# Patient Record
Sex: Male | Born: 1955 | Race: White | Hispanic: No | Marital: Married | Smoking: Never smoker
Health system: Southern US, Community
[De-identification: ages and names within clinical notes are randomized; demographics above are authoritative.]

## PROBLEM LIST (undated history)

## (undated) DIAGNOSIS — G4730 Sleep apnea, unspecified: Secondary | ICD-10-CM

## (undated) DIAGNOSIS — E785 Hyperlipidemia, unspecified: Secondary | ICD-10-CM

## (undated) DIAGNOSIS — C44611 Basal cell carcinoma of skin of unspecified upper limb, including shoulder: Secondary | ICD-10-CM

## (undated) DIAGNOSIS — S42001A Fracture of unspecified part of right clavicle, initial encounter for closed fracture: Secondary | ICD-10-CM

## (undated) DIAGNOSIS — Z86010 Personal history of colonic polyps: Secondary | ICD-10-CM

## (undated) DIAGNOSIS — F909 Attention-deficit hyperactivity disorder, unspecified type: Secondary | ICD-10-CM

## (undated) HISTORY — DX: Personal history of colonic polyps: Z86.010

## (undated) HISTORY — DX: Attention-deficit hyperactivity disorder, unspecified type: F90.9

## (undated) HISTORY — DX: Hyperlipidemia, unspecified: E78.5

## (undated) HISTORY — PX: POLYPECTOMY: SHX149

## (undated) HISTORY — DX: Fracture of unspecified part of right clavicle, initial encounter for closed fracture: S42.001A

## (undated) HISTORY — DX: Basal cell carcinoma of skin of unspecified upper limb, including shoulder: C44.611

## (undated) HISTORY — DX: Sleep apnea, unspecified: G47.30

---

## 2003-02-10 ENCOUNTER — Ambulatory Visit (HOSPITAL_COMMUNITY): Admission: RE | Admit: 2003-02-10 | Discharge: 2003-02-10 | Payer: Self-pay | Admitting: Pulmonary Disease

## 2009-09-27 DIAGNOSIS — S42001A Fracture of unspecified part of right clavicle, initial encounter for closed fracture: Secondary | ICD-10-CM

## 2009-09-27 HISTORY — DX: Fracture of unspecified part of right clavicle, initial encounter for closed fracture: S42.001A

## 2009-10-20 ENCOUNTER — Emergency Department (HOSPITAL_COMMUNITY): Admission: EM | Admit: 2009-10-20 | Discharge: 2009-10-20 | Payer: Self-pay | Admitting: Emergency Medicine

## 2010-09-11 ENCOUNTER — Ambulatory Visit: Payer: Self-pay | Admitting: Internal Medicine

## 2010-09-11 ENCOUNTER — Encounter: Payer: Self-pay | Admitting: Internal Medicine

## 2010-09-11 LAB — CONVERTED CEMR LAB
AST: 21 units/L (ref 0–37)
BUN: 18 mg/dL (ref 6–23)
Basophils Relative: 0.9 % (ref 0.0–3.0)
Bilirubin, Direct: 0.1 mg/dL (ref 0.0–0.3)
CO2: 31 meq/L (ref 19–32)
Calcium: 9.4 mg/dL (ref 8.4–10.5)
Chloride: 103 meq/L (ref 96–112)
Creatinine, Ser: 0.9 mg/dL (ref 0.4–1.5)
Eosinophils Absolute: 0.2 10*3/uL (ref 0.0–0.7)
GFR calc non Af Amer: 90.35 mL/min (ref >60.00)
HCT: 45.1 % (ref 39.0–52.0)
Hemoglobin: 15.7 g/dL (ref 13.0–17.0)
Lymphocytes Relative: 47 % — ABNORMAL HIGH (ref 12.0–46.0)
Lymphs Abs: 2.8 10*3/uL (ref 0.7–4.0)
MCHC: 34.8 g/dL (ref 30.0–36.0)
MCV: 88.9 fL (ref 78.0–100.0)
Neutrophils Relative %: 40.5 % — ABNORMAL LOW (ref 43.0–77.0)
Potassium: 4.2 meq/L (ref 3.5–5.1)
TSH: 0.73 microintl units/mL (ref 0.35–5.50)
Total Bilirubin: 0.9 mg/dL (ref 0.3–1.2)
VLDL: 24.4 mg/dL (ref 0.0–40.0)
WBC: 5.9 10*3/uL (ref 4.5–10.5)

## 2010-09-14 ENCOUNTER — Encounter: Payer: Self-pay | Admitting: Internal Medicine

## 2010-09-15 ENCOUNTER — Encounter (INDEPENDENT_AMBULATORY_CARE_PROVIDER_SITE_OTHER): Payer: Self-pay | Admitting: *Deleted

## 2010-09-16 ENCOUNTER — Ambulatory Visit: Payer: Self-pay | Admitting: Internal Medicine

## 2010-09-22 ENCOUNTER — Ambulatory Visit: Payer: Self-pay | Admitting: Internal Medicine

## 2010-09-22 DIAGNOSIS — Z860100 Personal history of colon polyps, unspecified: Secondary | ICD-10-CM

## 2010-09-22 HISTORY — DX: Personal history of colon polyps, unspecified: Z86.0100

## 2010-09-22 HISTORY — PX: COLONOSCOPY: SHX174

## 2010-09-22 HISTORY — DX: Personal history of colonic polyps: Z86.010

## 2010-09-23 ENCOUNTER — Encounter: Payer: Self-pay | Admitting: Internal Medicine

## 2010-10-29 NOTE — Miscellaneous (Signed)
Summary: [facility] Previsit/prep  Clinical Lists Changes  Medications: Added new medication of MOVIPREP 100 GM  SOLR (PEG-KCL-NACL-NASULF-NA ASC-C) As per prep instructions. - Signed Rx of MOVIPREP 100 GM  SOLR (PEG-KCL-NACL-NASULF-NA ASC-C) As per prep instructions.;  #1 x 0;  Signed;  Entered by: Wyona Almas RN;  Authorized by: Iva Boop MD, FACG;  Method used: Electronically to [id number] [address], [place], [place]  [address], Ph: [phone] or [phone], Fax: [phone] Observations: Added new observation of NKA: T (09/16/2010 8:33)    Prescriptions: MOVIPREP 100 GM  SOLR (PEG-KCL-NACL-NASULF-NA ASC-C) As per prep instructions.  #1 x 0   Entered by:   Wyona Almas RN   Authorized by:   Iva Boop MD, [facility]   Signed by:   Wyona Almas RN on 09/16/2010   Method used:   Electronically to        [facility] (retail)       [address]       [place], [place]  [address]       Ph: [phone] or [phone]       Fax: [phone]   RxID:   [id number]

## 2010-10-29 NOTE — Assessment & Plan Note (Signed)
Summary: New / Bcbs / # / cd   Vital Signs:  Patient profile:   55 year old male Height:      71 inches Weight:      186 pounds BMI:     26.04 O2 Sat:      97 % on Room air Temp:     98.6 degrees F oral Pulse rate:   70 / minute Pulse rhythm:   regular Resp:     16 per minute BP sitting:   130 / 72  (left arm) Cuff size:   large  Vitals Entered By: Rock Nephew CMA (September 11, 2010 10:58 AM)  Nutrition Counseling: Patient's BMI is greater than 25 and therefore counseled on weight management options.  O2 Flow:  Room air  Primary Care Ziare Orrick:  Etta Grandchild MD   History of Present Illness: New to me for a complete physical with no complaints.  Preventive Screening-Counseling & Management  Alcohol-Tobacco     Alcohol drinks/day: <1     Alcohol type: beer     >5/day in last 3 mos: no     Alcohol Counseling: not indicated; use of alcohol is not excessive or problematic     Feels need to cut down: no     Feels annoyed by complaints: no     Feels guilty re: drinking: no     Needs 'eye opener' in am: no     Smoking Status: never     Tobacco Counseling: not indicated; no tobacco use  Caffeine-Diet-Exercise     Does Patient Exercise: yes     Type of exercise: biking     Exercise (avg: min/session): 30-60     Times/week: 4  Hep-HIV-STD-Contraception     Hepatitis Risk: no risk noted     HIV Risk: no risk noted     STD Risk: no risk noted     TSE monthly: yes     Testicular SE Education/Counseling to perform regular STE     Sun Exposure-Excessive: no     Sun Exposure Counseling: to decrease sun exposure  Safety-Violence-Falls     Seat Belt Use: yes     Helmet Use: n/a     Firearms in the Home: firearms in the home     Firearm Counseling: to practice firearm safety     Smoke Detectors: yes     Violence in the Home: no risk noted     Sexual Abuse: no      Sexual History:  currently monogamous.        Drug Use:  no.        Blood Transfusions:  no.     Medications Prior to Update: 1)  None  Current Medications (verified): 1)  Methylphenidate Hcl Cr 20 Mg Cr-Tabs (Methylphenidate Hcl) .... 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 Never Smoked Alcohol use-yes Drug use-no Regular exercise-yes  Risk Factors: Alcohol Use: <1 (09/11/2010) >5 drinks/d w/in last 3 months: no (09/11/2010) Exercise: yes (09/11/2010)  Risk Factors: Smoking Status: never (09/11/2010)  Past Medical History: ADHD right clavicle fracture Jan 2011 BCC left forearm 10 years ago  Past Surgical History: Denies surgical history  Family History: none  Social History: 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  The patient denies anorexia, fever, weight loss, chest pain, syncope, dyspnea on exertion, peripheral edema, prolonged cough, headaches, hemoptysis, abdominal pain, melena, hematochezia, severe indigestion/heartburn, hematuria, suspicious skin lesions, transient blindness, difficulty walking, depression, abnormal bleeding, enlarged lymph nodes, angioedema, and testicular masses.    Physical Exam  General:  alert, well-developed, well-nourished, well-hydrated, appropriate dress, normal appearance, healthy-appearing, cooperative to examination, and good hygiene.   Head:  normocephalic, atraumatic, no abnormalities observed, and no abnormalities palpated.    Eyes:  vision grossly intact, pupils equal, pupils round, and pupils reactive to light.   Ears:  External ear exam shows no significant lesions or deformities.  Otoscopic examination reveals clear canals, tympanic membranes are intact bilaterally without bulging, retraction, inflammation or discharge. Hearing is grossly normal bilaterally. Nose:  External nasal examination shows no deformity or inflammation. Nasal mucosa are pink and moist without lesions or exudates. Mouth:  Oral mucosa and oropharynx without lesions or exudates.  Teeth in good repair. Neck:  supple, full ROM, no masses, no thyromegaly, no thyroid nodules or tenderness, no JVD, normal carotid upstroke, no carotid bruits, no cervical lymphadenopathy, and no neck tenderness.   Breasts:  No masses or gynecomastia noted Lungs:  normal respiratory effort, no intercostal retractions, no accessory muscle use, normal breath sounds, no dullness, no fremitus, no crackles, and no wheezes.   Heart:  normal rate, regular rhythm, no murmur, no gallop, no rub, and no JVD.   Abdomen:  soft, non-tender, normal bowel sounds, no distention, no masses, no guarding, no rigidity, no rebound tenderness, no abdominal hernia, no inguinal hernia, no hepatomegaly, and no splenomegaly.   Rectal:  No external abnormalities noted. Normal sphincter tone. No rectal masses or tenderness. Genitalia:  uncircumcised, no hydrocele, no varicocele, no scrotal masses, no testicular masses or atrophy, no cutaneous lesions, and no urethral discharge.   Prostate:  no gland enlargement, no nodules, no asymmetry, and no induration.   Msk:  normal ROM, no joint tenderness, no joint swelling, no joint warmth, no redness over joints, no joint deformities, no joint instability, no crepitation, and no muscle atrophy.   Pulses:  R and L carotid,radial,femoral,dorsalis pedis and posterior tibial pulses are full and equal bilaterally Extremities:  No clubbing, cyanosis, edema, or  deformity noted with normal full range of motion of all joints.   Neurologic:  No cranial nerve deficits noted. Station and gait are normal. Plantar reflexes are down-going bilaterally. DTRs are symmetrical throughout. Sensory, motor and coordinative functions appear intact. Skin:  turgor normal, color normal, no rashes, no suspicious lesions, no ecchymoses, no petechiae, no purpura, no ulcerations, and no edema.   Cervical Nodes:  no anterior cervical adenopathy and no posterior cervical adenopathy.   Axillary Nodes:  no R axillary adenopathy and no L axillary adenopathy.   Inguinal Nodes:  no R inguinal adenopathy and no L inguinal adenopathy.   Psych:  Cognition and judgment appear intact. Alert and cooperative with normal attention span and concentration. No apparent delusions, illusions, hallucinations   Impression & Recommendations:  Problem # 1:  ROUTINE GENERAL MEDICAL EXAM@HEALTH  CARE FACL (ICD-V70.0)  Orders: Venipuncture ([address]) TLB-Lipid Panel (80061-LIPID) TLB-BMP (Basic  Metabolic Panel-BMET) (80048-METABOL) TLB-CBC Platelet - w/Differential (85025-CBCD) TLB-Hepatic/Liver Function Pnl (80076-HEPATIC) TLB-TSH (Thyroid Stimulating Hormone) (84443-TSH) TLB-PSA (Prostate Specific Antigen) (84153-PSA) EKG w/ Interpretation ([address]) Hemoccult Guaiac-1 spec.(in office) ([address]) Gastroenterology Referral (GI)  Td Booster: Historical (09/27/2004)   Flu Vax: Historical (07/28/2010)    Discussed using sunscreen, use of alcohol, drug use, self testicular exam, routine dental care, routine eye care, routine physical exam, seat belts, multiple vitamins,  and recommendations for immunizations.  Discussed exercise and checking cholesterol.  Discussed gun safety, safe sex, and contraception. Also recommend checking PSA.  Complete Medication List: 1)  Methylphenidate Hcl Cr 20 Mg Cr-tabs (Methylphenidate hcl) .... Take 1 tablet by mouth three times a day  Colorectal Screening:  Current  Recommendations:    Hemoccult: NEG X 1 today    Colonoscopy recommended: scheduled with G.I.  PSA Screening:    Reviewed PSA screening recommendations: PSA ordered  Immunization & Chemoprophylaxis:    Tetanus vaccine: Historical  (09/27/2004)    Influenza vaccine: Historical  (07/28/2010)  Patient Instructions: 1)  Please schedule a follow-up appointment as needed. 2)  It is important that you exercise regularly at least 20 minutes 5 times a week. If you develop chest pain, have severe difficulty breathing, or feel very tired , stop exercising immediately and seek medical attention. 3)  You need to lose weight. Consider a lower calorie diet and regular exercise.  4)  Schedule a colonoscopy/sigmoidoscopy to help detect colon cancer. 5)  Take an Aspirin every day.   Orders Added: 1)  Venipuncture [[address]] 2)  TLB-Lipid Panel [80061-LIPID] 3)  TLB-BMP (Basic Metabolic Panel-BMET) [80048-METABOL] 4)  TLB-CBC Platelet - w/Differential [85025-CBCD] 5)  TLB-Hepatic/Liver Function Pnl [80076-HEPATIC] 6)  TLB-TSH (Thyroid Stimulating Hormone) [84443-TSH] 7)  TLB-PSA (Prostate Specific Antigen) [60454-UJW] 8)  New Patient 40-64 years [99[address]] 9)  EKG w/ Interpretation [93000] 10)  Hemoccult Guaiac-1 spec.(in office) [82270] 11)  Gastroenterology Referral [GI]   Immunization History:  Influenza Immunization History:    Influenza:  historical (07/28/2010)   Immunization History:  Influenza Immunization History:    Influenza:  Historical (07/28/2010)  Preventive Care Screening  Last Tetanus Booster:    Date:  09/27/2004    Results:  Historical

## 2010-10-29 NOTE — Procedures (Signed)
Summary: Colonoscopy  Patient: Cory Joyce Note: All result statuses are Final unless otherwise noted.  Tests: (1) Colonoscopy (COL)   COL Colonoscopy           DONE     [facility]     [address]     [place], [place]  [address]           COLONOSCOPY PROCEDURE REPORT           PATIENT:  Cory Joyce, Cory Joyce  MR#:  [id number]     BIRTHDATE:  1956/06/15, 53 yrs. old  GENDER:  male     ENDOSCOPIST:  Iva Boop, MD, [facility]     REF. BY:  Etta Grandchild, M.D.     PROCEDURE DATE:  09/22/2010     PROCEDURE:  Colonoscopy with snare polypectomy     ASA CLASS:  Class I     INDICATIONS:  Routine Risk Screening     MEDICATIONS:   Fentanyl 50 mcg IV, Versed 6 mg IV           DESCRIPTION OF PROCEDURE:   After the risks benefits and     alternatives of the procedure were thoroughly explained, informed     consent was obtained.  Digital rectal exam was performed and     revealed no abnormalities and normal prostate.   The LB CF-H180AL     [id number] endoscope was introduced through the anus and advanced to     the cecum, which was identified by both the appendix and ileocecal     valve, without limitations.  The quality of the prep was     excellent, using MoviPrep.  The instrument was then slowly     withdrawn as the colon was fully examined. Insertion: 4:35 minutes     Withdrawal: 14:04 minutes     <<PROCEDUREIMAGES>>           FINDINGS:  Three polyps were found. All diminutive ascending (1)     and sigmoid (2). Polyps were snared without cautery. Retrieval was     successful.  Mild diverticulosis was found in the sigmoid colon.     This was otherwise a normal examination of the colon.   Retroflexed     views in the right colon and  rectum revealed no abnormalities.     The scope was then withdrawn from the patient and the procedure     completed.           COMPLICATIONS:  None     ENDOSCOPIC IMPRESSION:     1) Three diminutive polyps removed, largest 5 mm.     2)  Mild diverticulosis in the sigmoid colon     3) Otherwise normal examination, excellent prep           REPEAT EXAM:  In for Colonoscopy, pending biopsy results.           Iva Boop, MD, [facility]           CC:  Etta Grandchild, MD     The Patient           n.     Rosalie Doctor:   Iva Boop at 09/22/2010 10:59 AM           Camelia Eng, [id number]  Note: An exclamation mark (!) indicates a result that was not dispersed into the flowsheet. Document Creation Date: 09/22/2010 10:59 AM _______________________________________________________________________  (1) Order result status: Final Collection or observation date-time: 09/22/2010 10:47  Requested date-time:  Receipt date-time:  Reported date-time:  Referring Physician:   Ordering Physician: Stan Head [phone]) Specimen Source:  Source: Launa Grill Order Number: [id number] Lab site:   Appended Document: Colonoscopy   Colonoscopy  Procedure date:  09/22/2010  Findings:          1) Three diminutive polyps removed, largest 5 mm.     2) Mild diverticulosis in the sigmoid colon     3) Otherwise normal examination, excellent prep  -  TUBULAR ADENOMAS (MULTIPLE FRAGMENTS). -  HYPERPLASTIC POLYP(S) (2 FRAGMENTS). -  HIGH GRADE DYSPLASIA IS NOT IDENTIFIED.  Procedures Next Due Date:    Colonoscopy: 09/2015   Appended Document: Colonoscopy     Procedures Next Due Date:    Colonoscopy: 08/2015

## 2010-10-29 NOTE — Letter (Signed)
Summary: Results Follow-up Letter  [facility]  [address] [place], [place] [address]   Phone: [phone]  Fax: [phone]    09/14/2010  [address] [place], [place]  [address]  Dear Cory Joyce,   The following are the results of your recent test(s):  Test     Result     CBC       normal Liver/kidney   normal Prostate     normal Thyroid     normal   _________________________________________________________  Please call for an appointment as needed _________________________________________________________ _________________________________________________________ _________________________________________________________  Sincerely,  Sanda Linger MD [facility]

## 2010-10-29 NOTE — Letter (Signed)
Summary: Lipid Letter  [facility]  [address] [place], [place] [address]   Phone: [phone]  Fax: [phone]    09/14/2010  Cory Joyce [address] [place], [place]  [address]  Dear Cory Joyce:  We have carefully reviewed your last lipid profile from  and the results are noted below with a summary of recommendations for lipid management.    Cholesterol:       201     Goal: <200   HDL "good" Cholesterol:   84.16     Goal: >40   LDL "bad" Cholesterol:   136     Goal: <130   Triglycerides:       122.0     Goal: <150        TLC Diet (Therapeutic Lifestyle Change): Saturated Fats & Transfatty acids should be kept < 7% of total calories ***Reduce Saturated Fats Polyunstaurated Fat can be up to 10% of total calories Monounsaturated Fat Fat can be up to 20% of total calories Total Fat should be no greater than 25-35% of total calories Carbohydrates should be 50-60% of total calories Protein should be approximately 15% of total calories Fiber should be at least 20-30 grams a day ***Increased fiber may help lower LDL Total Cholesterol should be < 200mg /day Consider adding plant stanol/sterols to diet (example: Benacol spread) ***A higher intake of unsaturated fat may reduce Triglycerides and Increase HDL    Adjunctive Measures (may lower LIPIDS and reduce risk of Heart Attack) include: Aerobic Exercise (20-30 minutes 3-4 times a week) Limit Alcohol Consumption Weight Reduction Aspirin 75-81 mg a day by mouth (if not allergic or contraindicated) Dietary Fiber 20-30 grams a day by mouth     Current Medications: 1)    Methylphenidate Hcl Cr 20 Mg Cr-tabs (Methylphenidate hcl) .... Take 1 tablet by mouth three times a day  If you have any questions, please call. We appreciate being able to work with you.   Sincerely,     [facility] Etta Grandchild MD

## 2010-10-29 NOTE — Letter (Signed)
Summary: Patient Notice- Polyp Results  [facility]  [address] [place], [place] [address]   Phone: [phone]  Fax: [phone]        September 23, 2010 MRN: [id number]    Cory Joyce [address] [place], [place]  [address]    Dear [name],  The polyps removed from your colon were adenomatous. This means that they were pre-cancerous or that  they had the potential to change into cancer over time.  I recommend that you have a repeat colonoscopy in 5 years to determine if you have developed any new polyps over time and screen for colorectal cancer. If you develop any new rectal bleeding, abdominal pain or significant bowel habit changes, please contact us before then.  In addition to repeating colonoscopy, changing health habits may reduce your risk of having more colon or rectal  polyps and possibly, colorectal cancer. You may lower your risk of future polyps and colorectal cancer by adopting healthy habits such as not smoking or using tobacco (if you do), being physically active, losing weight (if overweight), and eating a diet which includes fruits and vegetables and limits red meat.  Please call us if you are having persistent problems or have questions about your condition that have not been fully answered at this time.  Sincerely,  Iva Boop MD, [facility]  This letter has been electronically signed by your physician.  Appended Document: Patient Notice- Polyp Results Letter mailed

## 2010-10-29 NOTE — Letter (Signed)
Summary: [facility] Instructions  [facility]  [address] [place], [place] [address]   Phone: [phone]  Fax: [phone]       Cory Joyce    1956-04-30    MRN: [id number]        Procedure Day Dorna Bloom:  Jake Shark  09/22/10     Arrival Time:  9:00AM     Procedure Time:  10:00AM     Location of Procedure:                    _ X_  [facility] (4th Floor)                       PREPARATION FOR COLONOSCOPY WITH MOVIPREP   Starting 5 days prior to your procedure 09/17/10 do not eat nuts, seeds, popcorn, corn, beans, peas,  salads, or any raw vegetables.  Do not take any fiber supplements (e.g. Metamucil, Citrucel, and Benefiber).  THE DAY BEFORE YOUR PROCEDURE         DATE: 09/21/10  DAY: MONDAY  1.  Drink clear liquids the entire day-NO SOLID FOOD  2.  Do not drink anything colored red or purple.  Avoid juices with pulp.  No orange juice.  3.  Drink at least 64 oz. (8 glasses) of fluid/clear liquids during the day to prevent dehydration and help the prep work efficiently.  CLEAR LIQUIDS INCLUDE: Water Jello Ice Popsicles Tea (sugar ok, no milk/cream) Powdered fruit flavored drinks Coffee (sugar ok, no milk/cream) Gatorade Juice: apple, white grape, white cranberry  Lemonade Clear bullion, consomm, broth Carbonated beverages (any kind) Strained chicken noodle soup Hard Candy                             4.  In the morning, mix first dose of MoviPrep solution:    Empty 1 Pouch A and 1 Pouch B into the disposable container    Add lukewarm drinking water to the top line of the container. Mix to dissolve    Refrigerate (mixed solution should be used within 24 hrs)  5.  Begin drinking the prep at 5:00 p.m. The MoviPrep container is divided by 4 marks.   Every 15 minutes drink the solution down to the next mark (approximately 8 oz) until the full liter is complete.   6.  Follow completed prep with 16 oz of clear liquid of your choice  (Nothing red or purple).  Continue to drink clear liquids until bedtime.  7.  Before going to bed, mix second dose of MoviPrep solution:    Empty 1 Pouch A and 1 Pouch B into the disposable container    Add lukewarm drinking water to the top line of the container. Mix to dissolve    Refrigerate  THE DAY OF YOUR PROCEDURE      DATE: 09/22/10  DAY: TUESDAY  Beginning at 5:00AM (5 hours before procedure):         1. Every 15 minutes, drink the solution down to the next mark (approx 8 oz) until the full liter is complete.  2. Follow completed prep with 16 oz. of clear liquid of your choice.    3. You may drink clear liquids until 8:00AM (2 HOURS BEFORE PROCEDURE).   MEDICATION INSTRUCTIONS  Unless otherwise instructed, you should take regular prescription medications with a small sip of water   as early as possible the morning of  your procedure.        OTHER INSTRUCTIONS  You will need a responsible adult at least 54 years of age to accompany you and drive you home.   This person must remain in the waiting room during your procedure.  Wear loose fitting clothing that is easily removed.  Leave jewelry and other valuables at home.  However, you may wish to bring a book to read or  an iPod/MP3 player to listen to music as you wait for your procedure to start.  Remove all body piercing jewelry and leave at home.  Total time from sign-in until discharge is approximately 2-3 hours.  You should go home directly after your procedure and rest.  You can resume normal activities the  day after your procedure.  The day of your procedure you should not:   Drive   Make legal decisions   Operate machinery   Drink alcohol   Return to work  You will receive specific instructions about eating, activities and medications before you leave.    The above instructions have been reviewed and explained to me by   Wyona Almas RN  September 16, 2010 9:00 AM     I fully  understand and can verbalize these instructions _____________________________ Date _________

## 2012-01-18 ENCOUNTER — Ambulatory Visit (INDEPENDENT_AMBULATORY_CARE_PROVIDER_SITE_OTHER): Payer: 59 | Admitting: Endocrinology

## 2012-01-18 ENCOUNTER — Encounter: Payer: Self-pay | Admitting: Endocrinology

## 2012-01-18 VITALS — BP 128/86 | HR 84 | Temp 98.0°F | Ht 71.0 in | Wt 189.0 lb

## 2012-01-18 DIAGNOSIS — W57XXXA Bitten or stung by nonvenomous insect and other nonvenomous arthropods, initial encounter: Secondary | ICD-10-CM

## 2012-01-18 DIAGNOSIS — T148XXA Other injury of unspecified body region, initial encounter: Secondary | ICD-10-CM

## 2012-01-18 MED ORDER — DOXYCYCLINE HYCLATE 100 MG PO TABS: 100.0000 mg | ORAL_TABLET | Freq: Two times a day (BID) | ORAL | Status: DC

## 2012-01-18 MED ORDER — DOXYCYCLINE HYCLATE 100 MG PO TABS: 100.0000 mg | ORAL_TABLET | Freq: Two times a day (BID) | ORAL | Status: AC

## 2012-01-18 NOTE — Patient Instructions (Addendum)
i have sent a prescription to your pharmacy, for an antibiotic pill I hope you feel better soon.  If you don't feel better by next week, please call back.   

## 2012-01-18 NOTE — Progress Notes (Signed)
  Subjective:    Patient ID: Cory Joyce, male    DOB: [birth year], 56 y.o.   MRN: [id number]  HPI Pt removed a tick from his left leg 3 days ago.  since then, he has slight pain, and assoc itching Past Medical History  Diagnosis Date  . ADHD (attention deficit hyperactivity disorder)   . BCC (basal cell carcinoma), arm     left forearm 10 years ago  . Right clavicle fracture Jan 2011    No past surgical history on file.  History   Social History  . Marital Status: Married    Spouse Name: N/A    Number of Children: N/A  . Years of Education: N/A   Occupational History  . 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 Smoking status: Never Smoker   . Smokeless tobacco: Not on file  . Alcohol Use: Yes  . Drug Use: No  . Sexually Active: Not on file   Other Topics Concern  . Not on file   Social History Narrative   Regular exercise-yes    Current Outpatient Prescriptions on File Prior to Visit  Medication Sig Dispense Refill  . methylphenidate (RITALIN LA) 20 MG 24 hr capsule Take 20 mg by mouth 3 (three) times daily.        No Known Allergies  No family history on file.  BP 128/86  Pulse 84  Temp(Src) 98 F (36.7 C) (Oral)  Ht 5\' 11"  (1.803 m)  Wt 189 lb (85.73 kg)  BMI 26.36 kg/m2  SpO2 96%  Review of Systems Denies fever    Objective:   Physical Exam VITAL SIGNS:  See vs page GENERAL: no distress Left calf:  Few mm erythematous papule.  No ulcer.  No fluctuance     Assessment & Plan:  Tick bite, new

## 2012-01-19 DIAGNOSIS — W57XXXA Bitten or stung by nonvenomous insect and other nonvenomous arthropods, initial encounter: Secondary | ICD-10-CM | POA: Insufficient documentation

## 2013-06-25 ENCOUNTER — Other Ambulatory Visit: Payer: Self-pay | Admitting: Orthopedic Surgery

## 2013-06-25 DIAGNOSIS — M25521 Pain in right elbow: Secondary | ICD-10-CM

## 2013-07-02 ENCOUNTER — Ambulatory Visit
Admission: RE | Admit: 2013-07-02 | Discharge: 2013-07-02 | Disposition: A | Discharge status: Home / Self Care | Payer: Managed Care, Other (non HMO) | Source: Ambulatory Visit | Attending: Orthopedic Surgery | Admitting: Orthopedic Surgery

## 2013-07-02 DIAGNOSIS — M25521 Pain in right elbow: Secondary | ICD-10-CM

## 2014-06-04 ENCOUNTER — Telehealth: Payer: Self-pay | Admitting: Internal Medicine

## 2014-06-04 NOTE — Telephone Encounter (Signed)
Last time he saw Dr. Loanne Drilling for tick bite back in 12/2011.

## 2014-06-04 NOTE — Telephone Encounter (Signed)
Pt request to reestablish with Dr.Jones. Pt stated that last ov was about 5 years ago and he is healthy that's why he didn't come to see Dr. Ronnald Ramp. Pt has Barnes. Please advise.

## 2014-06-05 NOTE — Telephone Encounter (Signed)
yes

## 2014-07-01 ENCOUNTER — Ambulatory Visit (INDEPENDENT_AMBULATORY_CARE_PROVIDER_SITE_OTHER): Payer: BC Managed Care – PPO | Admitting: Internal Medicine

## 2014-07-01 ENCOUNTER — Other Ambulatory Visit (INDEPENDENT_AMBULATORY_CARE_PROVIDER_SITE_OTHER): Payer: BC Managed Care – PPO

## 2014-07-01 ENCOUNTER — Encounter: Payer: Self-pay | Admitting: Internal Medicine

## 2014-07-01 VITALS — BP 116/70 | HR 67 | Temp 98.2°F | Resp 16 | Ht 71.0 in | Wt 181.0 lb

## 2014-07-01 DIAGNOSIS — Z23 Encounter for immunization: Secondary | ICD-10-CM

## 2014-07-01 DIAGNOSIS — Z0000 Encounter for general adult medical examination without abnormal findings: Secondary | ICD-10-CM | POA: Insufficient documentation

## 2014-07-01 LAB — CBC WITH DIFFERENTIAL/PLATELET
BASOS ABS: 0 10*3/uL (ref 0.0–0.1)
BASOS PCT: 0.6 % (ref 0.0–3.0)
EOS PCT: 2.1 % (ref 0.0–5.0)
Eosinophils Absolute: 0.1 10*3/uL (ref 0.0–0.7)
HCT: 44.6 % (ref 39.0–52.0)
Hemoglobin: 14.9 g/dL (ref 13.0–17.0)
LYMPHS PCT: 46.2 % — AB (ref 12.0–46.0)
Lymphs Abs: 2.9 10*3/uL (ref 0.7–4.0)
MCHC: 33.3 g/dL (ref 30.0–36.0)
MCV: 88.1 fl (ref 78.0–100.0)
MONOS PCT: 7.3 % (ref 3.0–12.0)
Monocytes Absolute: 0.5 10*3/uL (ref 0.1–1.0)
NEUTROS ABS: 2.7 10*3/uL (ref 1.4–7.7)
NEUTROS PCT: 43.8 % (ref 43.0–77.0)
PLATELETS: 321 10*3/uL (ref 150.0–400.0)
RBC: 5.06 Mil/uL (ref 4.22–5.81)
RDW: 14.6 % (ref 11.5–15.5)
WBC: 6.2 10*3/uL (ref 4.0–10.5)

## 2014-07-01 LAB — COMPREHENSIVE METABOLIC PANEL
ALBUMIN: 4.4 g/dL (ref 3.5–5.2)
ALK PHOS: 65 U/L (ref 39–117)
ALT: 17 U/L (ref 0–53)
AST: 20 U/L (ref 0–37)
BILIRUBIN TOTAL: 0.7 mg/dL (ref 0.2–1.2)
BUN: 13 mg/dL (ref 6–23)
CALCIUM: 9.5 mg/dL (ref 8.4–10.5)
CO2: 30 mEq/L (ref 19–32)
Chloride: 101 mEq/L (ref 96–112)
Creatinine, Ser: 1 mg/dL (ref 0.4–1.5)
GFR: 79.18 mL/min (ref >60.00)
Glucose, Bld: 98 mg/dL (ref 70–99)
POTASSIUM: 4.6 meq/L (ref 3.5–5.1)
Sodium: 138 mEq/L (ref 135–145)
TOTAL PROTEIN: 7.4 g/dL (ref 6.0–8.3)

## 2014-07-01 LAB — LIPID PANEL
CHOL/HDL RATIO: 4
Cholesterol: 227 mg/dL — ABNORMAL HIGH (ref 0–200)
HDL: 51.3 mg/dL (ref >39.00)
LDL CALC: 165 mg/dL — AB (ref 0–99)
NonHDL: 175.7
Triglycerides: 56 mg/dL (ref 0.0–149.0)
VLDL: 11.2 mg/dL (ref 0.0–40.0)

## 2014-07-01 NOTE — Patient Instructions (Signed)

## 2014-07-01 NOTE — Progress Notes (Signed)
Pre visit review using our clinic review tool, if applicable. No additional management support is needed unless otherwise documented below in the visit note. 

## 2014-07-01 NOTE — Progress Notes (Signed)
   Subjective:    Patient ID: Cory Joyce, male    DOB: 1956/05/18, 58 y.o.   MRN: [id number]  HPI Comments: He returns for a complete physical and he tells me that he feels well.     Review of Systems  All other systems reviewed and are negative.      Objective:   Physical Exam  Vitals reviewed. Constitutional: He is oriented to person, place, and time. He appears well-developed and well-nourished. No distress.  HENT:  Head: Normocephalic and atraumatic.  Mouth/Throat: Oropharynx is clear and moist. No oropharyngeal exudate.  Eyes: Conjunctivae are normal. Right eye exhibits no discharge. Left eye exhibits no discharge. No scleral icterus.  Neck: Normal range of motion. Neck supple. No JVD present. No tracheal deviation present. No thyromegaly present.  Cardiovascular: Normal rate, regular rhythm, normal heart sounds and intact distal pulses.  Exam reveals no gallop and no friction rub.   No murmur heard. Pulmonary/Chest: Effort normal and breath sounds normal. No stridor. No respiratory distress. He has no wheezes. He has no rales. He exhibits no tenderness.  Abdominal: Soft. Bowel sounds are normal. He exhibits no distension and no mass. There is no tenderness. There is no rebound and no guarding. Hernia confirmed negative in the right inguinal area and confirmed negative in the left inguinal area.  Genitourinary: Rectum normal, prostate normal, testes normal and penis normal. Rectal exam shows no external hemorrhoid, no internal hemorrhoid, no fissure, no mass, no tenderness and anal tone normal. Guaiac negative stool. Prostate is not enlarged and not tender. Right testis shows no mass, no swelling and no tenderness. Right testis is descended. Left testis shows no mass, no swelling and no tenderness. Left testis is descended. Circumcised. No penile erythema or penile tenderness. No discharge found.  Musculoskeletal: Normal range of motion. He exhibits no edema and no  tenderness.  Lymphadenopathy:    He has no cervical adenopathy.       Right: No inguinal adenopathy present.       Left: No inguinal adenopathy present.  Neurological: He is oriented to person, place, and time.  Skin: Skin is warm and dry. No rash noted. He is not diaphoretic. No erythema. No pallor.  Psychiatric: He has a normal mood and affect. His behavior is normal. Judgment and thought content normal.    Lab Results  Component Value Date   WBC 5.9 09/11/2010   HGB 15.7 09/11/2010   HCT 45.1 09/11/2010   PLT 276.0 09/11/2010   GLUCOSE 91 09/11/2010   CHOL 201* 09/11/2010   TRIG 122.0 09/11/2010   HDL 41.10 09/11/2010   LDLDIRECT 136.1 09/11/2010   ALT 24 09/11/2010   AST 21 09/11/2010   NA 140 09/11/2010   K 4.2 09/11/2010   CL 103 09/11/2010   CREATININE 0.9 09/11/2010   BUN 18 09/11/2010   CO2 31 09/11/2010   TSH 0.73 09/11/2010   PSA 0.71 09/11/2010        Assessment & Plan:

## 2014-07-02 LAB — HEPATITIS C ANTIBODY: HCV Ab: NEGATIVE

## 2014-07-02 LAB — PSA: PSA: 0.89 ng/mL (ref 0.10–4.00)

## 2014-07-02 LAB — TSH: TSH: 0.69 u[IU]/mL (ref 0.35–4.50)

## 2014-07-02 NOTE — Assessment & Plan Note (Signed)
Exam done Labs ordered vaccines were reviewed Pt ed material was given

## 2014-07-03 ENCOUNTER — Encounter: Payer: Self-pay | Admitting: Internal Medicine

## 2014-08-12 ENCOUNTER — Ambulatory Visit: Payer: BC Managed Care – PPO | Admitting: Internal Medicine

## 2015-04-12 ENCOUNTER — Ambulatory Visit (INDEPENDENT_AMBULATORY_CARE_PROVIDER_SITE_OTHER): Payer: BLUE CROSS/BLUE SHIELD | Admitting: Family Medicine

## 2015-04-12 ENCOUNTER — Encounter: Payer: Self-pay | Admitting: Family Medicine

## 2015-04-12 VITALS — BP 130/80 | HR 87 | Temp 98.3°F | Ht 71.0 in | Wt 180.0 lb

## 2015-04-12 DIAGNOSIS — B880 Other acariasis: Secondary | ICD-10-CM | POA: Diagnosis not present

## 2015-04-12 MED ORDER — PERMETHRIN 5 % EX CREA: 1.0000 "application " | TOPICAL_CREAM | Freq: Once | CUTANEOUS | Status: DC

## 2015-04-12 MED ORDER — HYDROXYZINE HCL 10 MG PO TABS: 10.0000 mg | ORAL_TABLET | Freq: Three times a day (TID) | ORAL | Status: DC | PRN

## 2015-04-12 NOTE — Patient Instructions (Addendum)
Good to see you Monitor for sign of infection Try medicine again one more application to be safe I think likely chiggers though Hydroxyzine up to 3 times a day for itching.  See Dr. Ronnald Ramp in a week if not perfect.

## 2015-04-12 NOTE — Progress Notes (Signed)
[facility] [place],  [id number] Phone: [phone] Subjective:    I'm seeing this patient by the request  of:    CC: rash ? Poison ivy vs scabies  MBT:DHRCBULAGT Cory Joyce is a 59 y.o. male coming in with complaint of rash. Patient states on Wednesday he noticed this rash. Patient was working outside the day previous to the rash. Patient was seen in urgent care facility and was given medications for possible scabies. Patient also given prednisone. States that that over the course of the last week 4 hours if she has gotten significantly better but continues to have the rest. Patient just wants to make sure that he is being treated appropriately. Patient denies any numbness or tingling denies any worsening of the rash itself. Patient states that most of these areas seem to be scabbing over at this time.  Past Medical History  Diagnosis Date  . ADHD (attention deficit hyperactivity disorder)   . BCC (basal cell carcinoma), arm     left forearm 10 years ago  . Right clavicle fracture Jan 2011   No past surgical history on file. History  Substance Use Topics  . Smoking status: Never Smoker   . Smokeless tobacco: Never Used  . Alcohol Use: 8.4 oz/week    14 Cans of beer per week   No Known Allergies Family History  Problem Relation Age of Onset  . Prostate cancer Father   . Alcohol abuse Father   . Cancer Neg Hx   . Diabetes Neg Hx   . Drug abuse Neg Hx   . Early death Neg Hx   . Heart disease Neg Hx   . Hyperlipidemia Neg Hx   . Hypertension Neg Hx   . Kidney disease Neg Hx   . Stroke Neg Hx         Past medical history, social, surgical and family history all reviewed in electronic medical record.   Review of Systems: No headache, visual changes, nausea, vomiting, diarrhea, constipation, dizziness, abdominal pain, skin rash, fevers, chills, night sweats, weight loss, swollen lymph nodes, body aches, joint  swelling, muscle aches, chest pain, shortness of breath, mood changes.   Objective Blood pressure 130/80, pulse 87, temperature 98.3 F (36.8 C), temperature source Oral, height 5\' 11"  (1.803 m), weight 180 lb (81.647 kg), SpO2 97 %.  General: No apparent distress alert and oriented x3 mood and affect normal, dressed appropriately.  HEENT: Pupils equal, extraocular movements intact  Respiratory: Patient's speak in full sentences and does not appear short of breath  Cardiovascular: No lower extremity edema, non tender, no erythema  Skin: Patient has numerous areas that appear to have a bug bite. This goes from patient's right leg up to his back as well as chest. There is no signs of infection. Papule in nature. No erythema surrounding the areas at this time. Appears to be scabbing over at this time. Nontender on palpation Abdomen: Soft nontender  Neuro: Cranial nerves II through XII are intact, neurovascularly intact in all extremities with 2+ DTRs and 2+ pulses.  Lymph: No lymphadenopathy of posterior or anterior cervical chain or axillae bilaterally.  Gait normal with good balance and coordination.  MSK:  Non tender with full range of motion and good stability and symmetric strength and tone of shoulders, elbows, wrist, hip, knee and ankles bilaterally.     Impression and Recommendations:     This case required medical decision making of moderate complexity.   ;  l

## 2015-04-12 NOTE — Assessment & Plan Note (Signed)
I believe the patient has more of a sugar bite. I do think though that with patient not making significant improvement in patient's anxiety due to the situation and we will treat him again for the possibility of scabies. This is low likelihood though. We discussed icing regimen and we discussed hydroxyzine for possible symptomatically relief of the itching. Patient is not completely resolved in 1 week will come back and see primary care provider.

## 2015-04-12 NOTE — Progress Notes (Signed)
Pre visit review using our clinic review tool, if applicable. No additional management support is needed unless otherwise documented below in the visit note. 

## 2015-05-12 ENCOUNTER — Telehealth: Payer: Self-pay | Admitting: Internal Medicine

## 2015-05-12 DIAGNOSIS — Z1211 Encounter for screening for malignant neoplasm of colon: Secondary | ICD-10-CM

## 2015-05-12 NOTE — Telephone Encounter (Signed)
Referral for GI entered per protocol.

## 2015-05-12 NOTE — Telephone Encounter (Signed)
Pt wants to be sure he gets scheduled for his colonoscopy before the years end.  Please advise Pt can be reached at [phone]

## 2015-07-01 ENCOUNTER — Encounter: Payer: Self-pay | Admitting: Internal Medicine

## 2015-08-15 ENCOUNTER — Ambulatory Visit (AMBULATORY_SURGERY_CENTER): Payer: Self-pay | Admitting: *Deleted

## 2015-08-15 VITALS — Ht 70.0 in | Wt 190.4 lb

## 2015-08-15 DIAGNOSIS — Z86010 Personal history of colonic polyps: Secondary | ICD-10-CM

## 2015-08-15 NOTE — Progress Notes (Signed)
No egg or soy allergy No issues with past sedation No diet pills No home 02 use   emmi video to e mail     [email or web address]

## 2015-08-28 ENCOUNTER — Encounter: Payer: Self-pay | Admitting: Internal Medicine

## 2015-09-05 ENCOUNTER — Ambulatory Visit (AMBULATORY_SURGERY_CENTER): Payer: BLUE CROSS/BLUE SHIELD | Admitting: Internal Medicine

## 2015-09-05 ENCOUNTER — Encounter: Payer: Self-pay | Admitting: Internal Medicine

## 2015-09-05 VITALS — BP 102/67 | HR 60 | Temp 97.7°F | Resp 10 | Ht 70.0 in | Wt 190.0 lb

## 2015-09-05 DIAGNOSIS — D122 Benign neoplasm of ascending colon: Secondary | ICD-10-CM | POA: Diagnosis not present

## 2015-09-05 DIAGNOSIS — Z860100 Personal history of colon polyps, unspecified: Secondary | ICD-10-CM

## 2015-09-05 MED ORDER — SODIUM CHLORIDE 0.9 % IV SOLN: 500.0000 mL | INTRAVENOUS | Status: DC

## 2015-09-05 NOTE — Progress Notes (Signed)
Called to room to assist during endoscopic procedure.  Patient ID and intended procedure confirmed with present staff. Received instructions for my participation in the procedure from the performing physician.  

## 2015-09-05 NOTE — Patient Instructions (Addendum)
I removed one tiny polyp today so next colonoscopy 5-10 yrs. I will let you know pathology results and when to have another routine colonoscopy by mail.  I appreciate the opportunity to care for you. Gatha Mayer, MD, FACG  YOU HAD AN ENDOSCOPIC PROCEDURE TODAY AT [facility]:   Refer to the procedure report that was given to you for any specific questions about what was found during the examination.  If the procedure report does not answer your questions, please call your gastroenterologist to clarify.  If you requested that your care partner not be given the details of your procedure findings, then the procedure report has been included in a sealed envelope for you to review at your convenience later.  YOU SHOULD EXPECT: Some feelings of bloating in the abdomen. Passage of more gas than usual.  Walking can help get rid of the air that was put into your GI tract during the procedure and reduce the bloating. If you had a lower endoscopy (such as a colonoscopy or flexible sigmoidoscopy) you may notice spotting of blood in your stool or on the toilet paper. If you underwent a bowel prep for your procedure, you may not have a normal bowel movement for a few days.  Please Note:  You might notice some irritation and congestion in your nose or some drainage.  This is from the oxygen used during your procedure.  There is no need for concern and it should clear up in a day or so.  SYMPTOMS TO REPORT IMMEDIATELY:   Following lower endoscopy (colonoscopy or flexible sigmoidoscopy):  Excessive amounts of blood in the stool  Significant tenderness or worsening of abdominal pains  Swelling of the abdomen that is new, acute  Fever of 100F or higher  For urgent or emergent issues, a gastroenterologist can be reached at any hour by calling [phone].   DIET: Your first meal following the procedure should be a small meal and then it is ok to progress to your normal diet.  Heavy or fried foods are harder to digest and may make you feel nauseous or bloated.  Likewise, meals heavy in dairy and vegetables can increase bloating.  Drink plenty of fluids but you should avoid alcoholic beverages for 24 hours.  ACTIVITY:  You should plan to take it easy for the rest of today and you should NOT DRIVE or use heavy machinery until tomorrow (because of the sedation medicines used during the test).    FOLLOW UP: Our staff will call the number listed on your records the next business day following your procedure to check on you and address any questions or concerns that you may have regarding the information given to you following your procedure. If we do not reach you, we will leave a message.  However, if you are feeling well and you are not experiencing any problems, there is no need to return our call.  We will assume that you have returned to your regular daily activities without incident.  If any biopsies were taken you will be contacted by phone or by letter within the next 1-3 weeks.  Please call us at [phone] if you have not heard about the biopsies in 3 weeks.    SIGNATURES/CONFIDENTIALITY: You and/or your care partner have signed paperwork which will be entered into your electronic medical record.  These signatures attest to the fact that that the information above on your After Visit Summary has been reviewed and is  understood.  Full responsibility of the confidentiality of this discharge information lies with you and/or your care-partner.  Please review polyp handout provided.

## 2015-09-05 NOTE — Op Note (Signed)
[place]  [address] [place], [id number]   COLONOSCOPY PROCEDURE REPORT  PATIENT: Cory Joyce, Cory Joyce  MR#: [id number] BIRTHDATE: 03/21/1956 , 57  yrs. old GENDER: male ENDOSCOPIST: Gatha Mayer, MD, [facility] PROCEDURE DATE:  09/05/2015 PROCEDURE:   Colonoscopy, surveillance and Colonoscopy with biopsy First Screening Colonoscopy - Avg.  risk and is 50 yrs.  old or older - No.  Prior Negative Screening - Now for repeat screening. N/A  History of Adenoma - Now for follow-up colonoscopy & has been > or = to 3 yrs.  Yes hx of adenoma.  Has been 3 or more years since last colonoscopy.  Polyps removed today? Yes ASA CLASS:   Class II INDICATIONS:Surveillance due to prior colonic neoplasia and PH Colon Adenoma. MEDICATIONS: Propofol 400 mg IV, Monitored anesthesia care, and Lidocaine 40 mg IV  DESCRIPTION OF PROCEDURE:   After the risks benefits and alternatives of the procedure were thoroughly explained, informed consent was obtained.  The digital rectal exam revealed no abnormalities of the rectum, revealed no prostatic nodules, and revealed the prostate was not enlarged.   The LB CF-[id number] Loaner [id number] and LB [id number]  endoscope was introduced through the anus and advanced to the cecum, which was identified by both the appendix and ileocecal valve. No adverse events experienced. The quality of the prep was excellent.  (MiraLax was used)  The instrument was then slowly withdrawn as the colon was fully examined. Estimated blood loss is zero unless otherwise noted in this procedure report.      COLON FINDINGS: A polypoid shaped sessile polyp measuring 2 mm in size was found in the ascending colon.  A polypectomy was performed with cold forceps.  The resection was complete, the polyp tissue was completely retrieved and sent to histology.   The examination was otherwise normal.  Retroflexed views revealed no abnormalities. The time to cecum = 3.0  Withdrawal time = 15.5   The scope was withdrawn and the procedure completed. COMPLICATIONS: There were no immediate complications.  ENDOSCOPIC IMPRESSION: 1.   Sessile polyp was found in the ascending colon; polypectomy was performed with cold forceps 2.   The examination was otherwise normal - excellent prep - hx 1-2 small adenomas 2011  RECOMMENDATIONS: Timing of repeat colonoscopy will be determined by pathology findings.  eSigned:  Gatha Mayer, MD, [facility] 09/05/2015 2:04 PM   cc: The Patient

## 2015-09-08 ENCOUNTER — Ambulatory Visit: Payer: BLUE CROSS/BLUE SHIELD | Attending: Audiology | Admitting: Audiology

## 2015-09-08 ENCOUNTER — Telehealth: Payer: Self-pay

## 2015-09-08 DIAGNOSIS — H9325 Central auditory processing disorder: Secondary | ICD-10-CM

## 2015-09-08 DIAGNOSIS — R292 Abnormal reflex: Secondary | ICD-10-CM | POA: Diagnosis present

## 2015-09-08 DIAGNOSIS — H93292 Other abnormal auditory perceptions, left ear: Secondary | ICD-10-CM

## 2015-09-08 LAB — HM COLONOSCOPY

## 2015-09-08 NOTE — Telephone Encounter (Signed)
  Follow up Call-  Call back number 09/05/2015  Post procedure Call Back phone  # [phone] or [phone]  Permission to leave phone message Yes     Patient questions:  Do you have a fever, pain , or abdominal swelling? No. Pain Score  0 *  Have you tolerated food without any problems? Yes.    Have you been able to return to your normal activities? Yes.    Do you have any questions about your discharge instructions: Diet   No. Medications  No. Follow up visit  No.  Do you have questions or concerns about your Care? No.  Actions: * If pain score is 4 or above: No action needed, pain <4.

## 2015-09-08 NOTE — Procedures (Signed)
Outpatient Audiology and [facility], [place]  [id number] [phone]  AUDIOLOGICAL AND AUDITORY PROCESSING EVALUATION  NAME: Cory Joyce STATUS: Outpatient DOB:   1956/02/14   DIAGNOSIS: Evaluate for Central auditory                                                                                    processing disorder  MRN: [id number]                                                                                      DATE: 09/08/2015   REFERENT: Dr. Redmond Baseman, ENT   HISTORY: Cory Joyce was seen for an auditory processing evaluation following a hearing evaluation at [facility] in October 2016 which was "within normal limits except for borderline hearing thresholds in the left high frequencies". His primary concern is "difficulty hearing in background noise such as running water, machine noise or many voices".  This has created some hearing difficulty at home with his wife and creative solutions such as tapping him on the shoulder or making sure Cory Joyce knows his wife is talking to him before she begins to speak, have been used. Cory Joyce does wonder "whether a hearing aid would help".  He reports being diagnosed and treated "for ADHD for several years".  About "1 year ago", Cory Joyce noted "tinnitus that sounds like a low level hiss or faint white noise".  The tinnitus does not keep him from sleeping at night.  On a scale of 1 (no impact) - 10 (ruined), he rates that tinnitus as "1".  Cory Joyce denies vertigo or feeling off balance."  Medication; Methylphenidate and aspirin.  EVALUATION: The  audiological evaluation was completed on 07/22/2015 at [facility] - pure tones were repeated today to ensure no change in hearing with thresholds of 25-30 dBHL at '250Hz' ; 20-25 dBHL at '500Hz' ; and from '1000Hz'  - '8000Hz'  hearing thresholds were 5-10 dBHL on the right side and 15-20 dBHL on the left  side. The results ae consistent.  Speech reception thresholds are 10/15 dBHL on the left and 10dBHL on the right using recorded spondee word lists. Word recognition was 100% at 50 dBHL on the left at and 100% at 50 dBHL on the right using recorded NU-6 word lists, in quiet.  Otoscopic inspection reveals clear ear canals with visible tympanic membranes.  Tympanometry showed normal middle ear pressure volume and compliance (Type A) bilaterally. Ipsilateral and contralateral acoustic reflex es are present bilaterally but are slightly elevated for the right low frequencies.  Acoustic reflex decay is negative  bilaterally.  Distortion Product Otoacoustic Emissions (DPOAE) testing showed present responses  ear, which is consistent with good outer hair cell function from '2000Hz'  - 10,'000Hz'  bilaterally.   A summary of Cory Joyce's central auditory processing  evaluation is as follows:   Speech-in-Noise testing was performed to determine speech discrimination in the presence of background noise.  Cory Joyce Joyce 86% in the right ear (within normal limits) and 80% in the left ear (abnormal), when noise was presented 5 dB below speech. Cory Joyce is expected to have significant difficulty hearing and understanding in minimal background noise.       The Phonemic Synthesis test was administered to assess decoding and sound blending skills through word reception.  Cory Joyce's quantitative score was 25 correct which is within normal limits for decoding and sound-blending deficit in quiet.     The Staggered Spondaic Word Test Cory Joyce) was also administered.  This test uses spondee words (familiar words consisting of two monosyllabic words with equal stress on each word) as the test stimuli.  Different words are directed to each ear, competing and non-competing.  Cory Joyce has a central auditory processing disorder (CAPD) with slight signs in tolerance-fading memory.    Random Gap Detection test (RGDT- a revised AFT-R)  was administered to measure temporal processing of minute timing differences. Cory Joyce within normal limits with 10 msec detection.   Competing Sentences (CS) involved a different sentences being presented to each ear at different volumes. The instructions are to repeat the softer volume sentences. Posterior temporal issues will show poorer performance in the ear contralateral to the lobe involved.  Cory Joyce Joyce 90% in the right ear and 40% in the left ear.  The test results are abnormal bilaterally, poorer on the left side and are consistent with Central Auditory Processing Disorder (CAPD) and a binaural integration component.  Dichotic Digits (DD) presents different two digits to each ear. All four digits are to be repeated. Poor performance suggests that cerebellar and/or brainstem may be involved. Cory Joyce Joyce 90% in the right ear and 65% in the left ear. The test results are abnormal on the left side only and are consistent with Central Auditory Processing Disorder (CAPD).  Musiek's Frequency (Pitch) Pattern Test requires identification of high and low pitch tones presented each ear individually. Poor performance may occur with organization, learning issues or dyslexia.  Cory Joyce Joyce 100% in each ear which is within normal limits for this test.   Summary of Cory Joyce's areas of difficulty: Tolerance-Fading Memory (TFM) is associated with both difficulties understanding speech in the presence of background noise and poor short-term auditory memory.  Difficulties are usually seen in attention span, reading, comprehension and inferences, following directions, poor handwriting, auditory figure-ground, short term memory, expressive and receptive language, inconsistent articulation, oral and written discourse, and problems with distractibility.  Reduced Word Recognition in Minimal Background Noise on the left side only is the inability to hear in the presence of competing noise. This  problem may be easily mistaken for inattention.  Hearing may be excellent in a quiet room but become very poor when a fan, air conditioner or heater come on, paper is rattled or music is turned on. The background noise does not have to "sound loud" to a normal listener in order for it to be a problem for someone with an auditory processing disorder.      CONCLUSIONS: Kenai has normal to borderline normal hearing thresholds bilaterally.  His middle ear pressure, volume and compliance are within normal limits in each ear.  The ipsilateral and contralateral acoustic reflexes are within normal limits on the left side, but range from within normal limits to slightly elevated on the right side. Inner ear function is within normal limits on the  right side but on the left side show present responses in the lower frequencies and weak/abnormal high frequency responses.   Word recognition is excellent in quiet but drops significantly on the left side only. As discussed with Hardie Pulley monitoring of his hearing with a repeat audiological evaluation in 12 months is recommended - earlier if there is any change in hearing.  This evaluation may be completed here or at the ENT office.    Two auditory processing test batteries were administered today: [place]. Gilberto Joyce positive for having a Patent attorney Disorder (CAPD) on each of them. [facility] shows a slight sign of CAPD in the area of Tolerance Fading Memory.  The Musiek model confirmed difficulties with a competing message. Cari Joyce abnormal bilaterally, but especially on the left side when asked to repeat a sentence in one ear when a competing message was in the other. With a simpler task, such as repeating numbers, it continued to be abnormal on left side.  Left sided auditory weakness is a classic finding associated with Central Auditory Processing Disorder. Since Ulyess has poor word recognition with  competing messages, missing a significant amount of information in most listening situations is expected even with sitting near the hum of computers or overhead projectors. Cloyd needs to sit away from possible noise sources and near the speaker for optimal signal to noise, to improve the chance of correctly hearing.  Please be aware that  research is showing strategic seating to not be as beneficial as using amplification to improve the clarity and signal to noise ratio of the speaker's voice so that if [facility] is willing, low level amplification may be beneficial, but should be evaluated on trial basis.   A binaural integration component is also present which would indicate that Devlyn may have increased difficulty processing auditory information when more than one thing is going on. Optimal Integration involves efficient combining of the auditory with information from the other modalities and processing center. It is a complex function and when not functioning well suggests poor transmission, usually from one side of the  brain to the other.   Central Auditory Processing Disorder (CAPD) creates a hearing difference even when hearing thresholds are within normal limits.  It may be thought of as a hearing dyslexia because speech sounds may be heard out of order or there may be delays in the processing of the speech signal.   A common characteristic of those with CAPD is insecurity, low self-esteem and auditory fatigue from the extra effort it requires to attempt to hear with faulty processing.  The effects of ADHD and CAPD may create a cumulative effect adversely affecting hearing and listening.   RECOMMENDATIONS: 1.   Monitor hearing with a repeat hearing evaluation annually to monitor hearing thresholds and word recognition in minimal background noise.  Please schedule an earlier evaluation if there is any change in hearing.  2.   Auditory processing self-help computer programs are available  for IPAD and computer download.  Benefit has been shown with intensive use for 10-15 minutes,  4-5 days per week. Research is suggesting that using the programs for a short amount of time each day is better for the auditory processing development than completing the program in a short amount of time by doing it several hours per day.  To help hearing in background noise consider using  LACE  (teenage to adult), PC download or cd, [employer].  3.  Other self-help measures include: 1) have conversation face to face  2) minimize background noise when having a conversation- turn off the TV, move to a quiet area of the area 3) be aware that auditory processing problems become worse with fatigue and stress  4) Avoid having important conversation with Carlson's back to the speaker.   4.   Current research strongly indicates that learning to play a musical instrument results in improved neurological function related to auditory processing that benefits decoding, dyslexia and hearing in background noise. Although JAXSON ANGLIN has played music much of his life, it is recommended that this be continued/ Please refer to the following website for further info: www.brainvolts at [facility], Annia Friendly, PhD.   5.  A binaural integration component is present which would indicate that Matti would have increased difficulty processing auditory information when more than one thing is going on.  Consider dancing and/or other binaural integration activities such as karate or tai chi or yoga.  6.   Recommendations to enhance auditory processing:  Encourage the use of technology to assist auditory skills. Using apps on the ipad/tablet or phone is an effective strategy. Consider using a recording device such as a smartpen or live scribe smart pen.  This device records while writing taking notes. If Krishay makes a mark (asteric or star) in the accompanying  notebook when he wants to remember details, he may immediately return to the recording place where additional information is provided.   Daviyon has reduced word recognition in background noise and may miss information in the classroom.  The smart pen may help, but strategic placement for optimal hearing and recording will also be needed. Strategic placement should be away from noise sources, such as hall or street noise, ventilation fans or overhead projector noise etc.   CAPD may create delays in both understanding and response time. Repetition and rephrasing benefits those with CAPD.   Provide clear speech that is loud enough at the distance from the speaker.         Javis Abboud L. Heide Spark, [place], [place] 09/08/2015

## 2015-09-14 ENCOUNTER — Encounter: Payer: Self-pay | Admitting: Internal Medicine

## 2015-09-14 DIAGNOSIS — Z86010 Personal history of colonic polyps: Secondary | ICD-10-CM

## 2015-09-14 NOTE — Progress Notes (Signed)
Quick Note:  2 mm adenoma Repeat colonoscopy approx 2021-2 - hx 1-2 adenomas 2011 ______

## 2015-10-21 ENCOUNTER — Ambulatory Visit: Payer: BLUE CROSS/BLUE SHIELD | Admitting: Internal Medicine

## 2015-11-07 ENCOUNTER — Ambulatory Visit (INDEPENDENT_AMBULATORY_CARE_PROVIDER_SITE_OTHER)
Admission: RE | Admit: 2015-11-07 | Discharge: 2015-11-07 | Disposition: A | Discharge status: Home / Self Care | Payer: BLUE CROSS/BLUE SHIELD | Source: Ambulatory Visit | Attending: Nurse Practitioner | Admitting: Nurse Practitioner

## 2015-11-07 ENCOUNTER — Ambulatory Visit (INDEPENDENT_AMBULATORY_CARE_PROVIDER_SITE_OTHER): Payer: BLUE CROSS/BLUE SHIELD | Admitting: Nurse Practitioner

## 2015-11-07 ENCOUNTER — Encounter: Payer: Self-pay | Admitting: Nurse Practitioner

## 2015-11-07 VITALS — BP 124/84 | HR 95 | Temp 98.7°F | Resp 16 | Wt 186.0 lb

## 2015-11-07 DIAGNOSIS — B9789 Other viral agents as the cause of diseases classified elsewhere: Principal | ICD-10-CM

## 2015-11-07 DIAGNOSIS — J069 Acute upper respiratory infection, unspecified: Secondary | ICD-10-CM

## 2015-11-07 MED ORDER — METHYLPREDNISOLONE 4 MG PO TABS: ORAL_TABLET | ORAL | Status: DC

## 2015-11-07 NOTE — Progress Notes (Signed)
Pre visit review using our clinic review tool, if applicable. No additional management support is needed unless otherwise documented below in the visit note. 

## 2015-11-07 NOTE — Progress Notes (Signed)
Patient ID: SEAGER SANDBERG, male    DOB: 02/20/1956  Age: 60 y.o. MRN: [id number]  CC: Nasal Congestion   HPI KLAYTON LEVATINO presents for nasal congestion that started 3 weeks ago.  1) patient went to donate blood on Saturday: Double red cells He feels this lowered his immune system and allowed for his URI symptoms to be worse Drainage, SOB Clear, coughing for 3 weeks improving  Walks dog 3 times a day Feels breathless x 5 days with exercising  Patient reports he is working with cardiology on this  Treatment to date: Antihistamines, mucinex-not helpful  Denies sick contacts    History Santhiago has a past medical history of ADHD (attention deficit hyperactivity disorder); BCC (basal cell carcinoma), arm; Right clavicle fracture (Jan 2011); Sleep apnea; Hyperlipidemia; and Personal history of colonic polyps (09/22/2010).   He has past surgical history that includes Colonoscopy (09-22-2010).   His family history includes Alcohol abuse in his father; Colon polyps in his father; Prostate cancer in his father. There is no history of Cancer, Diabetes, Drug abuse, Early death, Heart disease, Hyperlipidemia, Hypertension, Kidney disease, Stroke, Colon cancer, Esophageal cancer, Rectal cancer, or Stomach cancer.He reports that he has never smoked. He has never used smokeless tobacco. He reports that he drinks about 8.4 oz of alcohol per week. He reports that he does not use illicit drugs.  Outpatient Prescriptions Prior to Visit  Medication Sig Dispense Refill  . aspirin 81 MG tablet Take 81 mg by mouth daily.    . methylphenidate (RITALIN LA) 20 MG 24 hr capsule Take 20 mg by mouth 3 (three) times daily.     No facility-administered medications prior to visit.   ROS Review of Systems  Constitutional: Positive for chills. Negative for fever, diaphoresis and fatigue.  HENT: Positive for congestion, postnasal drip, rhinorrhea, sinus pressure and sneezing. Negative for ear  pain and sore throat.   Respiratory: Positive for cough and shortness of breath.   Cardiovascular: Negative for palpitations.   Objective:  BP 124/84 mmHg  Pulse 95  Temp(Src) 98.7 F (37.1 C) (Oral)  Resp 16  Wt 186 lb (84.369 kg)  SpO2 98%  Physical Exam  Constitutional: He is oriented to person, place, and time. He appears well-developed and well-nourished. No distress.  HENT:  Head: Normocephalic and atraumatic.  Right Ear: External ear normal.  Left Ear: External ear normal.  Mouth/Throat: Oropharynx is clear and moist. No oropharyngeal exudate.  Eyes: EOM are normal. Pupils are equal, round, and reactive to light. Right eye exhibits no discharge. Left eye exhibits no discharge. No scleral icterus.  Neck: Normal range of motion. Neck supple.  Cardiovascular: Normal rate, regular rhythm and normal heart sounds.  Exam reveals no gallop and no friction rub.   No murmur heard. Pulmonary/Chest: Effort normal and breath sounds normal. No respiratory distress. He has no wheezes. He has no rales. He exhibits no tenderness.  Decreased air entry bilaterally  Lymphadenopathy:    He has no cervical adenopathy.  Neurological: He is alert and oriented to person, place, and time.  Skin: Skin is warm and dry. No rash noted. He is not diaphoretic.  Psychiatric: He has a normal mood and affect. His behavior is normal. Judgment and thought content normal.   Assessment & Plan:   Sabdiel was seen today for nasal congestion.  Diagnoses and all orders for this visit:  Viral URI with cough -     DG Chest 2 View; Future  Other orders -  methylPREDNISolone (MEDROL) 4 MG tablet; Take 6 tablets by mouth with breakfast or lunch and decrease by 1 tablet each day until gone.   I am having Mr. Briner start on methylPREDNISolone. I am also having him maintain his methylphenidate and aspirin.  Meds ordered this encounter  Medications  . methylPREDNISolone (MEDROL) 4 MG tablet    Sig:  Take 6 tablets by mouth with breakfast or lunch and decrease by 1 tablet each day until gone.    Dispense:  21 tablet    Refill:  0    Order Specific Question:  Supervising Provider    Answer:  Crecencio Mc [[id number]]     Follow-up: Return if symptoms worsen or fail to improve.

## 2015-11-07 NOTE — Patient Instructions (Addendum)
Prednisone with breakfast or lunch at the latest.  6 tablets on day 1, 5 tablets on day 2, 4 tablets on day 3, 3 tablets on day 4, 2 tablets day 5, 1 tablet on day 6...done! Take tablets all together not spaced out Don't take with NSAIDs (Ibuprofen, Aleve, Naproxen, Meloxicam ect...)   We will call with Chest X-ray results

## 2015-11-09 DIAGNOSIS — B9789 Other viral agents as the cause of diseases classified elsewhere: Principal | ICD-10-CM

## 2015-11-09 DIAGNOSIS — J069 Acute upper respiratory infection, unspecified: Secondary | ICD-10-CM | POA: Insufficient documentation

## 2015-11-09 NOTE — Assessment & Plan Note (Addendum)
New onset Will treat conservatively due to probable viral nature Prednisone taper to decrease inflammation given  Instructions for taking done verbally and on AVS Mucinex plain and benadryl at night encouraged  Obtaining CXR due to symptoms of SOB  FU prn worsening/failure to improve.

## 2015-12-01 ENCOUNTER — Other Ambulatory Visit (INDEPENDENT_AMBULATORY_CARE_PROVIDER_SITE_OTHER): Payer: BLUE CROSS/BLUE SHIELD

## 2015-12-01 ENCOUNTER — Encounter: Payer: Self-pay | Admitting: Internal Medicine

## 2015-12-01 ENCOUNTER — Ambulatory Visit (INDEPENDENT_AMBULATORY_CARE_PROVIDER_SITE_OTHER): Payer: BLUE CROSS/BLUE SHIELD | Admitting: Internal Medicine

## 2015-12-01 VITALS — BP 118/82 | HR 67 | Temp 98.4°F | Resp 16 | Ht 70.0 in | Wt 182.0 lb

## 2015-12-01 DIAGNOSIS — Z0000 Encounter for general adult medical examination without abnormal findings: Secondary | ICD-10-CM

## 2015-12-01 DIAGNOSIS — C4491 Basal cell carcinoma of skin, unspecified: Secondary | ICD-10-CM | POA: Insufficient documentation

## 2015-12-01 DIAGNOSIS — E785 Hyperlipidemia, unspecified: Secondary | ICD-10-CM | POA: Diagnosis not present

## 2015-12-01 DIAGNOSIS — L570 Actinic keratosis: Secondary | ICD-10-CM

## 2015-12-01 DIAGNOSIS — Q828 Other specified congenital malformations of skin: Secondary | ICD-10-CM

## 2015-12-01 LAB — URINALYSIS, ROUTINE W REFLEX MICROSCOPIC
BILIRUBIN URINE: NEGATIVE
Hgb urine dipstick: NEGATIVE
KETONES UR: NEGATIVE
Leukocytes, UA: NEGATIVE
NITRITE: NEGATIVE
RBC / HPF: NONE SEEN (ref 0–?)
Specific Gravity, Urine: 1.025 (ref 1.000–1.030)
Total Protein, Urine: NEGATIVE
URINE GLUCOSE: NEGATIVE
UROBILINOGEN UA: 0.2 (ref 0.0–1.0)
pH: 6 (ref 5.0–8.0)

## 2015-12-01 LAB — LIPID PANEL
CHOLESTEROL: 257 mg/dL — AB (ref 0–200)
HDL: 51.1 mg/dL (ref >39.00)
LDL CALC: 189 mg/dL — AB (ref 0–99)
NonHDL: 205.74
TRIGLYCERIDES: 82 mg/dL (ref 0.0–149.0)
Total CHOL/HDL Ratio: 5
VLDL: 16.4 mg/dL (ref 0.0–40.0)

## 2015-12-01 LAB — CBC WITH DIFFERENTIAL/PLATELET
BASOS PCT: 0.8 % (ref 0.0–3.0)
Basophils Absolute: 0.1 10*3/uL (ref 0.0–0.1)
EOS ABS: 0.3 10*3/uL (ref 0.0–0.7)
Eosinophils Relative: 4.9 % (ref 0.0–5.0)
HEMATOCRIT: 43.7 % (ref 39.0–52.0)
Hemoglobin: 15 g/dL (ref 13.0–17.0)
Lymphocytes Relative: 46 % (ref 12.0–46.0)
Lymphs Abs: 2.8 10*3/uL (ref 0.7–4.0)
MCHC: 34.2 g/dL (ref 30.0–36.0)
MCV: 83.7 fl (ref 78.0–100.0)
MONO ABS: 0.5 10*3/uL (ref 0.1–1.0)
Monocytes Relative: 8 % (ref 3.0–12.0)
NEUTROS ABS: 2.5 10*3/uL (ref 1.4–7.7)
NEUTROS PCT: 40.3 % — AB (ref 43.0–77.0)
PLATELETS: 320 10*3/uL (ref 150.0–400.0)
RBC: 5.22 Mil/uL (ref 4.22–5.81)
RDW: 15.3 % (ref 11.5–15.5)
WBC: 6.1 10*3/uL (ref 4.0–10.5)

## 2015-12-01 LAB — COMPREHENSIVE METABOLIC PANEL
ALT: 23 U/L (ref 0–53)
AST: 17 U/L (ref 0–37)
Albumin: 4.5 g/dL (ref 3.5–5.2)
Alkaline Phosphatase: 71 U/L (ref 39–117)
BUN: 22 mg/dL (ref 6–23)
CHLORIDE: 104 meq/L (ref 96–112)
CO2: 31 meq/L (ref 19–32)
Calcium: 9.6 mg/dL (ref 8.4–10.5)
Creatinine, Ser: 1.16 mg/dL (ref 0.40–1.50)
GFR: 68.69 mL/min (ref >60.00)
GLUCOSE: 112 mg/dL — AB (ref 70–99)
POTASSIUM: 4.2 meq/L (ref 3.5–5.1)
Sodium: 140 mEq/L (ref 135–145)
Total Bilirubin: 0.5 mg/dL (ref 0.2–1.2)
Total Protein: 6.9 g/dL (ref 6.0–8.3)

## 2015-12-01 LAB — FECAL OCCULT BLOOD, GUAIAC: Fecal Occult Blood: NEGATIVE

## 2015-12-01 LAB — TSH: TSH: 0.96 u[IU]/mL (ref 0.35–4.50)

## 2015-12-01 LAB — PSA: PSA: 0.96 ng/mL (ref 0.10–4.00)

## 2015-12-01 MED ORDER — ATORVASTATIN CALCIUM 40 MG PO TABS: 40.0000 mg | ORAL_TABLET | Freq: Every day | ORAL | Status: DC

## 2015-12-01 NOTE — Progress Notes (Signed)
Subjective:  Patient ID: Cory Joyce, male    DOB: 1956-08-29  Age: 60 y.o. MRN: [id number]  CC: Annual Exam; Rash; and Hyperlipidemia   HPI Cory Joyce presents for a CPX.  He complains of a red spot on his nose and is concerned that he may have a recurrence of BCC, he had a prior excision on his left forearm that has not recurred.  He also complains that he thinks he has warts on his left foot.  He is also concerned about his cholesterol level.  Outpatient Prescriptions Prior to Visit  Medication Sig Dispense Refill  . aspirin 81 MG tablet Take 81 mg by mouth daily.    . methylphenidate (RITALIN LA) 20 MG 24 hr capsule Take 20 mg by mouth 3 (three) times daily.    . methylPREDNISolone (MEDROL) 4 MG tablet Take 6 tablets by mouth with breakfast or lunch and decrease by 1 tablet each day until gone. 21 tablet 0   No facility-administered medications prior to visit.    ROS Review of Systems  Constitutional: Negative.  Negative for fever, chills and fatigue.  HENT: Negative.   Eyes: Negative.   Respiratory: Negative.  Negative for cough, choking, chest tightness, shortness of breath and stridor.   Cardiovascular: Negative.  Negative for chest pain, palpitations and leg swelling.  Gastrointestinal: Negative.  Negative for nausea, vomiting, abdominal pain, diarrhea and constipation.  Endocrine: Negative.   Genitourinary: Negative.  Negative for dysuria, urgency, frequency, hematuria, flank pain, difficulty urinating and testicular pain.  Musculoskeletal: Negative.  Negative for myalgias, back pain, joint swelling, arthralgias and neck pain.  Skin: Negative.  Negative for color change and rash.  Allergic/Immunologic: Negative.   Neurological: Negative.  Negative for dizziness, tremors, syncope, light-headedness, numbness and headaches.  Hematological: Negative.  Negative for adenopathy. Does not bruise/bleed easily.  Psychiatric/Behavioral: Negative.      Objective:  BP 118/82 mmHg  Pulse 67  Temp(Src) 98.4 F (36.9 C) (Oral)  Resp 16  Ht 5\' 10"  (1.778 m)  Wt 182 lb (82.555 kg)  BMI 26.11 kg/m2  SpO2 97%  BP Readings from Last 3 Encounters:  12/01/15 118/82  11/07/15 124/84  09/05/15 102/67    Wt Readings from Last 3 Encounters:  12/01/15 182 lb (82.555 kg)  11/07/15 186 lb (84.369 kg)  09/05/15 190 lb (86.183 kg)    Physical Exam  Constitutional: He is oriented to person, place, and time. He appears well-developed and well-nourished. No distress.  HENT:  Head: Normocephalic and atraumatic.  Mouth/Throat: Oropharynx is clear and moist. No oropharyngeal exudate.  Eyes: Conjunctivae are normal. Right eye exhibits no discharge. Left eye exhibits no discharge. No scleral icterus.  Neck: Normal range of motion. Neck supple. No JVD present. No tracheal deviation present. No thyromegaly present.  Cardiovascular: Normal rate, regular rhythm, normal heart sounds and intact distal pulses.  Exam reveals no gallop and no friction rub.   No murmur heard. Pulses:      Carotid pulses are 1+ on the right side, and 1+ on the left side.      Radial pulses are 1+ on the right side, and 1+ on the left side.       Femoral pulses are 1+ on the right side, and 1+ on the left side.      Popliteal pulses are 1+ on the right side, and 1+ on the left side.       Dorsalis pedis pulses are 1+ on the right side, and  1+ on the left side.       Posterior tibial pulses are 1+ on the right side, and 1+ on the left side.  EKG-  Sinus  Rhythm  WITHIN NORMAL LIMITS   Pulmonary/Chest: Effort normal and breath sounds normal. No stridor. No respiratory distress. He has no wheezes. He has no rales. He exhibits no tenderness.  Abdominal: Soft. Bowel sounds are normal. He exhibits no distension and no mass. There is no tenderness. There is no rebound and no guarding. Hernia confirmed negative in the right inguinal area and confirmed negative in the left  inguinal area.  Genitourinary: Rectum normal, prostate normal, testes normal and penis normal. Rectal exam shows no external hemorrhoid, no internal hemorrhoid, no fissure, no mass, no tenderness and anal tone normal. Guaiac negative stool. Prostate is not enlarged and not tender. Right testis shows no mass, no swelling and no tenderness. Right testis is descended. Left testis shows no mass, no swelling and no tenderness. Left testis is descended. Circumcised. No penile erythema or penile tenderness. No discharge found.  Musculoskeletal: Normal range of motion. He exhibits no edema or tenderness.  Lymphadenopathy:    He has no cervical adenopathy.       Right: No inguinal adenopathy present.       Left: No inguinal adenopathy present.  Neurological: He is oriented to person, place, and time.  Skin: Skin is warm and dry. Rash noted. He is not diaphoretic. No erythema. No pallor.     Psychiatric: He has a normal mood and affect. His behavior is normal. Judgment and thought content normal.  Vitals reviewed.   Lab Results  Component Value Date   WBC 6.1 12/01/2015   HGB 15.0 12/01/2015   HCT 43.7 12/01/2015   PLT 320.0 12/01/2015   GLUCOSE 112* 12/01/2015   CHOL 257* 12/01/2015   TRIG 82.0 12/01/2015   HDL 51.10 12/01/2015   LDLDIRECT 136.1 09/11/2010   LDLCALC 189* 12/01/2015   ALT 23 12/01/2015   AST 17 12/01/2015   NA 140 12/01/2015   K 4.2 12/01/2015   CL 104 12/01/2015   CREATININE 1.16 12/01/2015   BUN 22 12/01/2015   CO2 31 12/01/2015   TSH 0.96 12/01/2015   PSA 0.96 12/01/2015    Dg Chest 2 View  11/07/2015  CLINICAL DATA:  Three weeks of cough and chest congestion, nonsmoker EXAM: CHEST  2 VIEW COMPARISON:  None in PACs FINDINGS: The lungs are well-expanded and clear. The heart and pulmonary vascularity are normal. The mediastinum is normal in width. There is no pleural effusion. The bony thorax exhibits no acute abnormality. IMPRESSION: Mild hyperinflation may be  voluntary or may reflect reactive airway disease or acute bronchitis. There is no alveolar pneumonia. Electronically Signed   By: David  Martinique M.D.   On: 11/07/2015 14:42    Assessment & Plan:   Cory Joyce was seen today for annual exam, rash and hyperlipidemia.  Diagnoses and all orders for this visit:  Routine general medical examination at a health care facility- vaccines were reviewed and updated, exam completed, labs ordered and reviewed, colonoscopy is UTD, pt ed material was given -     Lipid panel; Future -     CBC with Differential/Platelet; Future -     Comprehensive metabolic panel; Future -     PSA; Future -     TSH; Future -     Urinalysis, Routine w reflex microscopic (not at [facility]); Future -     EKG 12-Lead  BCC (basal cell carcinoma of skin)- derm referral -     Ambulatory referral to Dermatology  Keratosis of plantar aspect of foot- podiatry referral for treatment -     Ambulatory referral to Podiatry  Hyperlipidemia with target LDL less than 130- His Framingham risk score is up to 10% so Avastin to start a statin to reduce his risk of heart attack and stroke. -     atorvastatin (LIPITOR) 40 MG tablet; Take 1 tablet (40 mg total) by mouth daily.  I have discontinued Cory Joyce methylPREDNISolone. I am also having him start on atorvastatin. Additionally, I am having him maintain his methylphenidate and aspirin.  Meds ordered this encounter  Medications  . atorvastatin (LIPITOR) 40 MG tablet    Sig: Take 1 tablet (40 mg total) by mouth daily.    Dispense:  90 tablet    Refill:  3     Follow-up: Return if symptoms worsen or fail to improve.  Scarlette Calico, MD

## 2015-12-01 NOTE — Patient Instructions (Signed)

## 2015-12-01 NOTE — Progress Notes (Signed)
Pre visit review using our clinic review tool, if applicable. No additional management support is needed unless otherwise documented below in the visit note. 

## 2015-12-02 ENCOUNTER — Telehealth: Payer: Self-pay | Admitting: Internal Medicine

## 2015-12-02 NOTE — Telephone Encounter (Signed)
Pt would like a call back to discuss labs

## 2016-01-28 DIAGNOSIS — M7712 Lateral epicondylitis, left elbow: Secondary | ICD-10-CM | POA: Insufficient documentation

## 2016-05-11 ENCOUNTER — Encounter: Payer: Self-pay | Admitting: Internal Medicine

## 2016-05-11 ENCOUNTER — Other Ambulatory Visit (INDEPENDENT_AMBULATORY_CARE_PROVIDER_SITE_OTHER): Payer: BLUE CROSS/BLUE SHIELD

## 2016-05-11 ENCOUNTER — Ambulatory Visit (INDEPENDENT_AMBULATORY_CARE_PROVIDER_SITE_OTHER): Payer: BLUE CROSS/BLUE SHIELD | Admitting: Internal Medicine

## 2016-05-11 VITALS — BP 128/80 | HR 83 | Temp 97.9°F | Resp 16 | Ht 70.0 in | Wt 189.5 lb

## 2016-05-11 DIAGNOSIS — R739 Hyperglycemia, unspecified: Secondary | ICD-10-CM | POA: Insufficient documentation

## 2016-05-11 DIAGNOSIS — E785 Hyperlipidemia, unspecified: Secondary | ICD-10-CM

## 2016-05-11 DIAGNOSIS — R0683 Snoring: Secondary | ICD-10-CM | POA: Diagnosis not present

## 2016-05-11 LAB — BASIC METABOLIC PANEL
BUN: 12 mg/dL (ref 6–23)
CALCIUM: 9.5 mg/dL (ref 8.4–10.5)
CO2: 29 meq/L (ref 19–32)
CREATININE: 1.16 mg/dL (ref 0.40–1.50)
Chloride: 106 mEq/L (ref 96–112)
GFR: 68.58 mL/min (ref >60.00)
GLUCOSE: 91 mg/dL (ref 70–99)
Potassium: 4.1 mEq/L (ref 3.5–5.1)
SODIUM: 142 meq/L (ref 135–145)

## 2016-05-11 LAB — LIPID PANEL
Cholesterol: 137 mg/dL (ref 0–200)
HDL: 50.5 mg/dL (ref >39.00)
LDL CALC: 78 mg/dL (ref 0–99)
NONHDL: 86.86
Total CHOL/HDL Ratio: 3
Triglycerides: 45 mg/dL (ref 0.0–149.0)
VLDL: 9 mg/dL (ref 0.0–40.0)

## 2016-05-11 LAB — HEMOGLOBIN A1C: Hgb A1c MFr Bld: 5.4 % (ref 4.6–6.5)

## 2016-05-11 NOTE — Progress Notes (Signed)
Pre visit review using our clinic review tool, if applicable. No additional management support is needed unless otherwise documented below in the visit note. 

## 2016-05-11 NOTE — Patient Instructions (Signed)

## 2016-05-11 NOTE — Progress Notes (Signed)
Subjective:  Patient ID: Cory Joyce, male    DOB: Jul 05, 1956  Age: 60 y.o. MRN: [id number]  CC: Hyperlipidemia   HPI Cory Joyce presents for f/up on hypercholesterolemia. He is tolerating statin therapy well with no muscle or joint aches. He is doing great with his lifestyle modifications as well. He and his wife are concerned about his heavy snoring and he wants to have a sleep study done.  Outpatient Medications Prior to Visit  Medication Sig Dispense Refill  . aspirin 81 MG tablet Take 81 mg by mouth daily.    Marland Kitchen atorvastatin (LIPITOR) 40 MG tablet Take 1 tablet (40 mg total) by mouth daily. 90 tablet 3  . methylphenidate (RITALIN LA) 20 MG 24 hr capsule Take 20 mg by mouth 3 (three) times daily.     No facility-administered medications prior to visit.     ROS Review of Systems  Constitutional: Negative for activity change, appetite change, fatigue and unexpected weight change.  HENT: Negative.   Eyes: Negative.   Respiratory: Negative.  Negative for cough, chest tightness, shortness of breath, wheezing and stridor.   Cardiovascular: Negative.  Negative for chest pain, palpitations and leg swelling.  Gastrointestinal: Negative.  Negative for abdominal pain, constipation, nausea and vomiting.  Endocrine: Negative.   Genitourinary: Negative.   Musculoskeletal: Negative.  Negative for arthralgias, back pain, myalgias and neck pain.  Skin: Negative.   Allergic/Immunologic: Negative.   Neurological: Negative.   Hematological: Negative.  Negative for adenopathy. Does not bruise/bleed easily.  Psychiatric/Behavioral: Negative.     Objective:  BP 128/80 (BP Location: Left Arm, Patient Position: Sitting, Cuff Size: Normal)   Pulse 83   Temp 97.9 F (36.6 C) (Oral)   Resp 16   Ht 5\' 10"  (1.778 m)   Wt 189 lb 8 oz (86 kg)   SpO2 98%   BMI 27.19 kg/m   BP Readings from Last 3 Encounters:  05/11/16 128/80  12/01/15 118/82  11/07/15 124/84    Wt  Readings from Last 3 Encounters:  05/11/16 189 lb 8 oz (86 kg)  12/01/15 182 lb (82.6 kg)  11/07/15 186 lb (84.4 kg)    Physical Exam  Constitutional: He is oriented to person, place, and time. No distress.  HENT:  Mouth/Throat: Oropharynx is clear and moist. No oropharyngeal exudate.  Eyes: Conjunctivae are normal. Right eye exhibits no discharge. Left eye exhibits no discharge. No scleral icterus.  Neck: Normal range of motion. Neck supple. No JVD present. No tracheal deviation present. No thyromegaly present.  Cardiovascular: Normal rate, regular rhythm, normal heart sounds and intact distal pulses.  Exam reveals no gallop and no friction rub.   No murmur heard. Pulmonary/Chest: Effort normal and breath sounds normal. No stridor. No respiratory distress. He has no wheezes. He has no rales. He exhibits no tenderness.  Abdominal: Soft. Bowel sounds are normal. He exhibits no distension and no mass. There is no tenderness. There is no rebound and no guarding.  Musculoskeletal: Normal range of motion. He exhibits no edema, tenderness or deformity.  Lymphadenopathy:    He has no cervical adenopathy.  Neurological: He is oriented to person, place, and time.  Skin: Skin is warm and dry. No rash noted. He is not diaphoretic. No erythema. No pallor.  Vitals reviewed.   Lab Results  Component Value Date   WBC 6.1 12/01/2015   HGB 15.0 12/01/2015   HCT 43.7 12/01/2015   PLT 320.0 12/01/2015   GLUCOSE 91 05/11/2016  CHOL 137 05/11/2016   TRIG 45.0 05/11/2016   HDL 50.50 05/11/2016   LDLDIRECT 136.1 09/11/2010   LDLCALC 78 05/11/2016   ALT 23 12/01/2015   AST 17 12/01/2015   NA 142 05/11/2016   K 4.1 05/11/2016   CL 106 05/11/2016   CREATININE 1.16 05/11/2016   BUN 12 05/11/2016   CO2 29 05/11/2016   TSH 0.96 12/01/2015   PSA 0.96 12/01/2015   HGBA1C 5.4 05/11/2016    Dg Chest 2 View  Result Date: 11/07/2015 CLINICAL DATA:  Three weeks of cough and chest congestion,  nonsmoker EXAM: CHEST  2 VIEW COMPARISON:  None in PACs FINDINGS: The lungs are well-expanded and clear. The heart and pulmonary vascularity are normal. The mediastinum is normal in width. There is no pleural effusion. The bony thorax exhibits no acute abnormality. IMPRESSION: Mild hyperinflation may be voluntary or may reflect reactive airway disease or acute bronchitis. There is no alveolar pneumonia. Electronically Signed   By: David  Martinique M.D.   On: 11/07/2015 14:42    Assessment & Plan:   Cory Joyce was seen today for hyperlipidemia.  Diagnoses and all orders for this visit:  Hyperglycemia- Improvement noted with lifestyle modifications, he is not prediabetic -     Basic metabolic panel; Future -     Hemoglobin A1c; Future  Hyperlipidemia with target LDL less than 130- he has achieved his LDL goal is doing well on statin therapy. -     Lipid panel; Future  Snoring -     Ambulatory referral to Pulmonology   I am having Cory Joyce maintain his aspirin, atorvastatin, terbinafine, Naftifine HCl, Vitamin D (Cholecalciferol), and methylphenidate.  Meds ordered this encounter  Medications  . terbinafine (LAMISIL) 250 MG tablet    Sig: TAKE 1 TABLET BY MOUTH DAILY  . Naftifine HCl (NAFTIN) 2 % GEL    Sig: APPLY TO AFFECTED AREA TWICE A DAY  . Vitamin D, Cholecalciferol, 1000 units CAPS    Sig: Take by mouth.  . methylphenidate (METADATE ER) 20 MG ER tablet    Sig: Take 20 mg by mouth 3 (three) times daily.     Follow-up: Return in about 6 months (around 11/11/2016).  Cory Calico, MD

## 2016-06-02 ENCOUNTER — Encounter: Payer: Self-pay | Admitting: Pulmonary Disease

## 2016-06-02 ENCOUNTER — Ambulatory Visit (INDEPENDENT_AMBULATORY_CARE_PROVIDER_SITE_OTHER): Payer: BLUE CROSS/BLUE SHIELD | Admitting: Pulmonary Disease

## 2016-06-02 DIAGNOSIS — G4710 Hypersomnia, unspecified: Secondary | ICD-10-CM | POA: Diagnosis not present

## 2016-06-02 DIAGNOSIS — G4730 Sleep apnea, unspecified: Secondary | ICD-10-CM

## 2016-06-02 NOTE — Patient Instructions (Addendum)
It is nice to meet you today. We will schedule  a home sleep study today We will call you with the results. If you qualify for CPAP we will order the device. Follow up with Dr. Corrie Dandy 4-6 weeks after starting CPAP if indicated. Please contact office for sooner follow up if symptoms do not improve or worsen or seek emergency care

## 2016-06-02 NOTE — Progress Notes (Signed)
Subjective:    Patient ID: Cory Joyce, male    DOB: November 13, 1955, 60 y.o.   MRN: [id number]   Cory Joyce is a 60 year old male never smoker with previously diagnosed ( 2004) moderate sleep apnea  (Seen by Dr. Gwenette Joyce). He wore CPAP x 6 months, and then stopped. He is here for re-evaluation at the request of his psychiatrist Dr. Clovis Joyce.  HPI Pt. Presents to the office for consultation for sleep apnea. He was seen in 2004 by Dr. Gwenette Joyce and had a sleep test, and used a CPAP device for 6-8 months.His sleep apnea was diagnosed as mild, he was given the option to stop treatment, which he did. He has not had any significant problems since then. He has recently purchased a bed that allows him to elevate his head to prevent snoring.He goes to bed each night between 11 pm and 12 am. He awakens around 6 am. He falls asleep within 5-10 minutes of going to bed.He has ADHD and is on Methylphenidate.daily, 20 mg TID. He has been taking the Methylphenidate since 1998. He takes his medication at 6 am, 11 and 4 pm. The medication is out of his system prior to bed.He has had a 5 pound weight gain in the last 2 years.due to reduced exercise levels.He does not have daytime sleepiness on his Methylphenidate.He is a never smoker. He does have one alcoholic beverage about once daily. Either beer or wine, and occasionally scotch.Epworth Score is 16. The patient's psychiatrist, Dr. Benay Joyce has suggested that he come in for evaluation. He would also like this information shared with Cory Joyce, at [facility].  Past Medical History:  Diagnosis Date  . ADHD (attention deficit hyperactivity disorder)   . BCC (basal cell carcinoma), arm    left forearm 10 years ago  . Hyperlipidemia    borderline but no treatment  . Personal history of colonic polyps 09/22/2010  . Right clavicle fracture Jan 2011  . Sleep apnea    tried but unsuccesssful for c pap   Past Surgical History:  Procedure  Laterality Date  . COLONOSCOPY  09-22-2010   Family History  Problem Relation Age of Onset  . Prostate cancer Father   . Alcohol abuse Father   . Colon polyps Father   . Cancer Neg Hx   . Diabetes Neg Hx   . Drug abuse Neg Hx   . Early death Neg Hx   . Heart disease Neg Hx   . Hyperlipidemia Neg Hx   . Hypertension Neg Hx   . Kidney disease Neg Hx   . Stroke Neg Hx   . Colon cancer Neg Hx   . Esophageal cancer Neg Hx   . Rectal cancer Neg Hx   . Stomach cancer Neg Hx    Social History   Social History  . Marital status: Married    Spouse name: N/A  . Number of children: N/A  . Years of education: N/A   Occupational History  . Gaffer for [employer]    Social History Main Topics  . Smoking status: Never Smoker  . Smokeless tobacco: Never Used  . Alcohol use 8.4 oz/week    14 Cans of beer per week     Comment: several a week   . Drug use: No  . Sexual activity: Yes   Other Topics Concern  . None   Social History Narrative   Regular exercise-yes   . Review of Systems  Constitutional: Negative for  fever and unexpected weight change.  HENT: Negative for congestion, dental problem, ear pain, nosebleeds, postnasal drip, rhinorrhea, sinus pressure, sneezing, sore throat and trouble swallowing.   Eyes: Negative for redness and itching.  Respiratory: Negative for cough, chest tightness, shortness of breath and wheezing.   Cardiovascular: Negative for palpitations and leg swelling.  Gastrointestinal: Negative for nausea and vomiting.  Genitourinary: Negative for dysuria.  Musculoskeletal: Negative for joint swelling.  Skin: Negative for rash.  Neurological: Negative for headaches.  Hematological: Does not bruise/bleed easily.  Psychiatric/Behavioral: Negative for dysphoric mood. The patient is not nervous/anxious.        Objective:   Physical Exam BP 130/68 (BP Location: Left Arm, Cuff Size: Normal)   Pulse 77   Ht 5\' 10"  (1.778 m)   Wt 188 lb 12.8 oz  (85.6 kg)   SpO2 98%   BMI 27.09 kg/m   Physical Exam:  General- No distress,  A&Ox3, pleasant ENT: No sinus tenderness, TM clear, pale nasal mucosa, no oral exudate,no post nasal drip, no LAN Cardiac: S1, S2, regular rate and rhythm, no murmur Chest: No wheeze/ rales/ dullness; no accessory muscle use, no nasal flaring, no sternal retractions Abd.: Soft Non-tender Ext: No clubbing cyanosis, edema Neuro:  normal strength Skin: No rashes, warm and dry Psych: normal mood and behavior      Assessment & Plan:   Hypersomnolence Snoring Past diagnosis of Moderate OSA ( 2004) Short term CPAP treatment ( 6-8 months) ADHD diagnosis  Plan We will schedule  a home sleep study today We will call you with the results. If you qualify for CPAP we will order the device through your DME. Follow up with Dr. Corrie Dandy or NP in  4-6 weeks after starting CPAP if indicated. Please contact office for sooner follow up if symptoms do not improve or worsen or seek emergency care   Thank you for the referral and the  opportunity to participate in Cory Joyce's care.   Cory Joyce, AGACNP-BC [facility] 06/02/2016  ATTENDING NOTE / ATTESTATION NOTE :   I have discussed the case with the resident/APP  Cory Joyce.   I agree with the resident/APP's  history, physical examination, assessment, and plans.    I have edited the above note and modified it according to our agreed history, physical examination, assessment and plan.   Briefly, patient being seen by Dr. Clovis Joyce, comes in with hypersomnia. Patient has been on the same dose of Ritalin (20 mg TID) since 1990s for ADHD. He had a sleep study 5 or 6 years ago which showed mild sleep apnea. He was placed on CPAP but he was not too symptomatic so that has been discontinued. Through the years, he has been more symptomatic. Has hypersomnia, snoring, witnessed apneas, gasping, choking. Hypersomnia affects his  functionality, especially this does not take the rhythm. Generally healthy otherwise. Unremarkable physical examination. Plan for home sleep study. He anticipates he will have a hard time sleeping in the lab. Most likely will have mild-moderate sleep apnea. If mild sleep apnea, plan to call patient and ask whether he wants to try CPAP. If moderate or severe sleep apnea, will need to be treated with auto CPAP and get good compliance.  Return to clinic in 6-8 weeks.      Monica Becton, MD 06/02/2016, 4:50 PM [place] [facility] Pager [phone] After 3 pm or if no answer, call [phone]

## 2016-06-02 NOTE — Assessment & Plan Note (Signed)
Hypersomnolence  Plan: We will schedule  a home sleep study today We will call you with the results. If you qualify for CPAP we will order the device. Follow up with Dr. Corrie Dandy 4-6 weeks after starting CPAP if indicated. Please contact office for sooner follow up if symptoms do not improve or worsen or seek emergency care

## 2016-06-24 ENCOUNTER — Ambulatory Visit (INDEPENDENT_AMBULATORY_CARE_PROVIDER_SITE_OTHER): Payer: BLUE CROSS/BLUE SHIELD

## 2016-06-24 DIAGNOSIS — Z23 Encounter for immunization: Secondary | ICD-10-CM

## 2016-06-25 DIAGNOSIS — G4733 Obstructive sleep apnea (adult) (pediatric): Secondary | ICD-10-CM | POA: Diagnosis not present

## 2016-06-28 ENCOUNTER — Ambulatory Visit: Payer: BLUE CROSS/BLUE SHIELD | Admitting: Adult Health

## 2016-06-30 ENCOUNTER — Telehealth: Payer: Self-pay | Admitting: Pulmonary Disease

## 2016-06-30 DIAGNOSIS — G4733 Obstructive sleep apnea (adult) (pediatric): Secondary | ICD-10-CM

## 2016-06-30 NOTE — Telephone Encounter (Signed)
  Please call the pt and tell the pt the [place]  showed OSA. Significant OSA.  Pt stops breathing  15  times an hour.   Home sleep study was done on : 06/25/16  Please order autoCPAP 5-15 cm H2O. Patient will need a mask fitting session. Patient will need a 1 month download.   Patient needs to be seen by me or any of the NPs/APPs  4-6 weeks after obtaining the cpap machine. Let me know if you receive this.   Thanks!   J. Shirl Harris, MD 06/30/2016, 1:34 PM

## 2016-07-01 ENCOUNTER — Other Ambulatory Visit: Payer: Self-pay | Admitting: *Deleted

## 2016-07-01 DIAGNOSIS — G4710 Hypersomnia, unspecified: Secondary | ICD-10-CM

## 2016-07-01 DIAGNOSIS — G4730 Sleep apnea, unspecified: Principal | ICD-10-CM

## 2016-07-01 NOTE — Telephone Encounter (Signed)
LMTCB

## 2016-07-02 NOTE — Telephone Encounter (Signed)
Pt aware of sleep study results. Pt voiced understanding and had no further questions. Order placed for cpap. Nothing further needed.

## 2016-07-02 NOTE — Telephone Encounter (Signed)
Pt returning call.Cory Joyce ° °

## 2016-08-04 ENCOUNTER — Telehealth: Payer: Self-pay | Admitting: Pulmonary Disease

## 2016-08-04 NOTE — Telephone Encounter (Signed)
Called and spoke to pt. Pt states he has not heard from DME after CPAP order was placed on 06/30/2016. Order was sent to [facility]. Called [facility] and received after hours VM. WCB on 08/05/16, pt aware.

## 2016-08-05 NOTE — Telephone Encounter (Signed)
Pt returning call and wanted you to know that he has been in contact [facility] and he has a date for mask fitting next week.Cory Joyce

## 2016-08-05 NOTE — Telephone Encounter (Signed)
Called [facility] @ [phone] and spoke with Izora Gala, she then transferred me to the [place] location Spoke with Lenna Sciara w/ [facility] - per their records, they spoke with pt on 10.9.17 to verify insurance coverage and for whatever reason, pt did not schedule set up.  She will call pt now to do this.  LMOM TCB x1 for pt

## 2016-08-24 ENCOUNTER — Telehealth: Payer: Self-pay | Admitting: Pulmonary Disease

## 2016-08-24 NOTE — Telephone Encounter (Signed)
  Per DME, pt needs f/u with me or NP.  pls schedule a f/u with me or NP (for his cpap) anytime from12/16/17 - 11/08/16.  The sooner the better.  Thanks.  Monica Becton, MD 08/24/2016, 9:49 PM [place] Pager [phone] After 3 pm or if no answer, call [phone]

## 2016-08-25 NOTE — Telephone Encounter (Signed)
Nothing further needed 

## 2016-08-25 NOTE — Telephone Encounter (Signed)
Spoke with pt. And he agreed to the appointment. The appointment was set up with Tammy in Dec.

## 2016-09-13 ENCOUNTER — Ambulatory Visit (INDEPENDENT_AMBULATORY_CARE_PROVIDER_SITE_OTHER): Payer: BLUE CROSS/BLUE SHIELD | Admitting: Adult Health

## 2016-09-13 ENCOUNTER — Encounter: Payer: Self-pay | Admitting: Adult Health

## 2016-09-13 VITALS — BP 116/72 | HR 69 | Ht 70.0 in | Wt 187.4 lb

## 2016-09-13 DIAGNOSIS — G4730 Sleep apnea, unspecified: Secondary | ICD-10-CM | POA: Diagnosis not present

## 2016-09-13 DIAGNOSIS — G4733 Obstructive sleep apnea (adult) (pediatric): Secondary | ICD-10-CM

## 2016-09-13 DIAGNOSIS — G4710 Hypersomnia, unspecified: Secondary | ICD-10-CM | POA: Diagnosis not present

## 2016-09-13 NOTE — Progress Notes (Signed)
Subjective:    Patient ID: Cory Joyce, male    DOB: May 04, 1956, 60 y.o.   MRN: [id number]  HPI 60 yo male seen for sleep consult 05/2016 found to have Moderate OSA on HST started on CPAP   TEST  HST 06/30/16 >AHI 15/hr   09/13/2016 Follow up : OSA  Pt presents for follow up for sleep apnea. He had HST in Oct found to have moderate OSA w/ AHI at 15/hr .  He was started on CPAP .At bedtime . Says he is doing well on CPAP .  Unable to get download today , DME order requested.  Does feel more rested. Trying to get used to wearing CPAP .    Past Medical History:  Diagnosis Date  . ADHD (attention deficit hyperactivity disorder)   . BCC (basal cell carcinoma), arm    left forearm 10 years ago  . Hyperlipidemia    borderline but no treatment  . Personal history of colonic polyps 09/22/2010  . Right clavicle fracture Jan 2011  . Sleep apnea    tried but unsuccesssful for c pap   Current Outpatient Prescriptions on File Prior to Visit  Medication Sig Dispense Refill  . aspirin 81 MG tablet Take 81 mg by mouth daily.    Marland Kitchen atorvastatin (LIPITOR) 40 MG tablet Take 1 tablet (40 mg total) by mouth daily. 90 tablet 3  . methylphenidate (METADATE ER) 20 MG ER tablet Take 20 mg by mouth 3 (three) times daily.    . Naftifine HCl (NAFTIN) 2 % GEL APPLY TO AFFECTED AREA TWICE A DAY    . Vitamin D, Cholecalciferol, 1000 units CAPS Take by mouth.     No current facility-administered medications on file prior to visit.      Review of Systems Constitutional:   No  weight loss, night sweats,  Fevers, chills, fatigue, or  lassitude.  HEENT:   No headaches,  Difficulty swallowing,  Tooth/dental problems, or  Sore throat,                No sneezing, itching, ear ache, nasal congestion, post nasal drip,   CV:  No chest pain,  Orthopnea, PND, swelling in lower extremities, anasarca, dizziness, palpitations, syncope.   GI  No heartburn, indigestion, abdominal pain, nausea, vomiting,  diarrhea, change in bowel habits, loss of appetite, bloody stools.   Resp: No shortness of breath with exertion or at rest.  No excess mucus, no productive cough,  No non-productive cough,  No coughing up of blood.  No change in color of mucus.  No wheezing.  No chest wall deformity  Skin: no rash or lesions.  GU: no dysuria, change in color of urine, no urgency or frequency.  No flank pain, no hematuria   MS:  No joint pain or swelling.  No decreased range of motion.  No back pain.  Psych:  No change in mood or affect. No depression or anxiety.  No memory loss.         Objective:   Physical Exam   Vitals:   09/13/16 1707  BP: 116/72  Pulse: 69  SpO2: 100%  Weight: 187 lb 6.4 oz (85 kg)  Height: 5\' 10"  (1.778 m)    GEN: A/Ox3; pleasant , NAD, well nourished    HEENT:  Lely/AT,  EACs-clear, TMs-wnl, NOSE-clear, THROAT-clear, no lesions, no postnasal drip or exudate noted. Mustache   NECK:  Supple w/ fair ROM; no JVD; normal carotid impulses w/o bruits; no thyromegaly  or nodules palpated; no lymphadenopathy.    RESP  Clear  P & A; w/o, wheezes/ rales/ or rhonchi. no accessory muscle use, no dullness to percussion  CARD:  RRR, no m/r/g  , no peripheral edema, pulses intact, no cyanosis or clubbing.  GI:   Soft & nt; nml bowel sounds; no organomegaly or masses detected.   Musco: Warm bil, no deformities or joint swelling noted.   Neuro: alert, no focal deficits noted.    Skin: Warm, no lesions or rashes  Aowyn Rozeboom NP-C  [facility]  09/13/2016        Assessment & Plan:

## 2016-09-13 NOTE — Patient Instructions (Addendum)
Continue on CPAP At bedtime   Wear for at least 4-6hr each night .  Do not drive if sleepy.  Follow up Dr. Corrie Dandy in 3-4 months and As needed    We will call once download is available .

## 2016-09-14 ENCOUNTER — Encounter: Payer: Self-pay | Admitting: Adult Health

## 2016-09-17 ENCOUNTER — Telehealth: Payer: Self-pay | Admitting: Adult Health

## 2016-09-17 DIAGNOSIS — G4733 Obstructive sleep apnea (adult) (pediatric): Secondary | ICD-10-CM

## 2016-09-17 NOTE — Assessment & Plan Note (Signed)
Moderate OSA - doing well on CPAP  Get CPAP download   Plan  Patient Instructions  Continue on CPAP At bedtime   Wear for at least 4-6hr each night .  Do not drive if sleepy.  Follow up Dr. Corrie Dandy in 3-4 months and As needed    We will call once download is available .

## 2016-09-17 NOTE — Telephone Encounter (Signed)
Cpap download received- resulted by TP:  Control is ok, would like it to be better.  Download is positive for mask leaks- make sure mask is fitting properly.  Patient needs to increase usage hours each night, and change pressure to set cpap pressure of 11cm- needs download in 1 month.    Spoke with pt, aware of recs.  Order placed for pressure change and download.  Nothing further needed.

## 2016-11-15 ENCOUNTER — Other Ambulatory Visit: Payer: Self-pay | Admitting: Internal Medicine

## 2016-11-15 ENCOUNTER — Encounter: Payer: Self-pay | Admitting: Internal Medicine

## 2016-11-15 DIAGNOSIS — E785 Hyperlipidemia, unspecified: Secondary | ICD-10-CM

## 2016-11-15 MED ORDER — ATORVASTATIN CALCIUM 40 MG PO TABS: 40.0000 mg | ORAL_TABLET | Freq: Every day | ORAL | 0 refills | Status: DC

## 2016-11-16 MED ORDER — ATORVASTATIN CALCIUM 40 MG PO TABS: 40.0000 mg | ORAL_TABLET | Freq: Every day | ORAL | 0 refills | Status: DC

## 2016-11-22 ENCOUNTER — Ambulatory Visit (INDEPENDENT_AMBULATORY_CARE_PROVIDER_SITE_OTHER): Payer: BLUE CROSS/BLUE SHIELD | Admitting: Internal Medicine

## 2016-11-22 ENCOUNTER — Encounter: Payer: Self-pay | Admitting: Internal Medicine

## 2016-11-22 ENCOUNTER — Other Ambulatory Visit (INDEPENDENT_AMBULATORY_CARE_PROVIDER_SITE_OTHER): Payer: BLUE CROSS/BLUE SHIELD

## 2016-11-22 VITALS — BP 130/70 | HR 84 | Temp 98.1°F | Ht 70.0 in | Wt 187.5 lb

## 2016-11-22 DIAGNOSIS — M25511 Pain in right shoulder: Secondary | ICD-10-CM | POA: Diagnosis not present

## 2016-11-22 DIAGNOSIS — G8929 Other chronic pain: Secondary | ICD-10-CM | POA: Diagnosis not present

## 2016-11-22 DIAGNOSIS — Z0000 Encounter for general adult medical examination without abnormal findings: Secondary | ICD-10-CM

## 2016-11-22 LAB — CBC WITH DIFFERENTIAL/PLATELET
BASOS ABS: 0.1 10*3/uL (ref 0.0–0.1)
Basophils Relative: 1 % (ref 0.0–3.0)
EOS ABS: 0.2 10*3/uL (ref 0.0–0.7)
Eosinophils Relative: 3.1 % (ref 0.0–5.0)
HEMATOCRIT: 40.2 % (ref 39.0–52.0)
HEMOGLOBIN: 14.1 g/dL (ref 13.0–17.0)
LYMPHS PCT: 42.7 % (ref 12.0–46.0)
Lymphs Abs: 3.1 10*3/uL (ref 0.7–4.0)
MCHC: 35 g/dL (ref 30.0–36.0)
MCV: 86.2 fl (ref 78.0–100.0)
MONO ABS: 0.6 10*3/uL (ref 0.1–1.0)
Monocytes Relative: 8.2 % (ref 3.0–12.0)
Neutro Abs: 3.2 10*3/uL (ref 1.4–7.7)
Neutrophils Relative %: 45 % (ref 43.0–77.0)
Platelets: 314 10*3/uL (ref 150.0–400.0)
RBC: 4.66 Mil/uL (ref 4.22–5.81)
RDW: 13.9 % (ref 11.5–15.5)
WBC: 7.2 10*3/uL (ref 4.0–10.5)

## 2016-11-22 MED ORDER — ETODOLAC ER 400 MG PO TB24: 400.0000 mg | ORAL_TABLET | Freq: Every day | ORAL | 1 refills | Status: DC

## 2016-11-22 NOTE — Patient Instructions (Signed)
Shoulder Pain Many things can cause shoulder pain, including:  An injury to the area.  Overuse of the shoulder.  Arthritis. The source of the pain can be:  Inflammation.  An injury to the shoulder joint.  An injury to a tendon, ligament, or bone. Follow these instructions at home: Take these actions to help with your pain:  Squeeze a soft ball or a foam pad as much as possible. This helps to keep the shoulder from swelling. It also helps to strengthen the arm.  Take over-the-counter and prescription medicines only as told by your health care provider.  If directed, apply ice to the area:  Put ice in a plastic bag.  Place a towel between your skin and the bag.  Leave the ice on for 20 minutes, 2-3 times per day. Stop applying ice if it does not help with the pain.  If you were given a shoulder sling or immobilizer:  Wear it as told.  Remove it to shower or bathe.  Move your arm as little as possible, but keep your hand moving to prevent swelling. Contact a health care provider if:  Your pain gets worse.  Your pain is not relieved with medicines.  New pain develops in your arm, hand, or fingers. Get help right away if:  Your arm, hand, or fingers:  Tingle.  Become numb.  Become swollen.  Become painful.  Turn white or blue. This information is not intended to replace advice given to you by your health care provider. Make sure you discuss any questions you have with your health care provider. Document Released: 06/23/2005 Document Revised: 05/09/2016 Document Reviewed: 01/06/2015 Elsevier Interactive Patient Education  2017 [employer]  

## 2016-11-22 NOTE — Progress Notes (Signed)
Pre visit review using our clinic review tool, if applicable. No additional management support is needed unless otherwise documented below in the visit note. 

## 2016-11-22 NOTE — Progress Notes (Signed)
Subjective:  Patient ID: Cory Joyce, male    DOB: 03/12/1956  Age: 61 y.o. MRN: [id number]  CC: Annual Exam   HPI Cory Joyce presents for a CPX.  He complains of worsening right shoulder pain over the last few weeks with decreased range of motion. He does repetitive activities when he throws a ball to his dogs but he denies any specific recent trauma or injury. He notices most of the pain over the posterior shoulder when he does external rotation. He is not taking anything for pain but he does request something for pain.  Outpatient Medications Prior to Visit  Medication Sig Dispense Refill  . aspirin 81 MG tablet Take 81 mg by mouth daily.    Marland Kitchen atorvastatin (LIPITOR) 40 MG tablet Take 1 tablet (40 mg total) by mouth daily. 90 tablet 0  . methylphenidate (METADATE ER) 20 MG ER tablet Take 20 mg by mouth 3 (three) times daily.    Marland Kitchen atomoxetine (STRATTERA) 80 MG capsule Take 100 mg by mouth daily.     . Naftifine HCl (NAFTIN) 2 % GEL APPLY TO AFFECTED AREA TWICE A DAY    . Vitamin D, Cholecalciferol, 1000 units CAPS Take by mouth.     No facility-administered medications prior to visit.     ROS Review of Systems  Constitutional: Negative.  Negative for diaphoresis, fatigue and unexpected weight change.  HENT: Negative.  Negative for trouble swallowing.   Respiratory: Negative.  Negative for cough, chest tightness, shortness of breath and wheezing.   Cardiovascular: Negative for chest pain, palpitations and leg swelling.  Gastrointestinal: Negative for abdominal pain, constipation, diarrhea, nausea and vomiting.  Endocrine: Negative.   Genitourinary: Negative.  Negative for difficulty urinating, dysuria, flank pain, frequency, hematuria, scrotal swelling and testicular pain.  Musculoskeletal: Positive for arthralgias. Negative for back pain, neck pain and neck stiffness.  Skin: Negative.   Allergic/Immunologic: Negative.   Neurological: Negative.  Negative for  dizziness, weakness, light-headedness and headaches.  Hematological: Negative.  Negative for adenopathy. Does not bruise/bleed easily.  Psychiatric/Behavioral: Negative.     Objective:  BP 130/70 (BP Location: Left Arm, Patient Position: Sitting, Cuff Size: Normal)   Pulse 84   Temp 98.1 F (36.7 C) (Oral)   Ht 5\' 10"  (1.778 m)   Wt 187 lb 8 oz (85 kg)   SpO2 98%   BMI 26.90 kg/m   BP Readings from Last 3 Encounters:  11/22/16 130/70  09/13/16 116/72  06/02/16 130/68    Wt Readings from Last 3 Encounters:  11/22/16 187 lb 8 oz (85 kg)  09/13/16 187 lb 6.4 oz (85 kg)  06/02/16 188 lb 12.8 oz (85.6 kg)    Physical Exam  Constitutional: He is oriented to person, place, and time. No distress.  HENT:  Mouth/Throat: Oropharynx is clear and moist. No oropharyngeal exudate.  Eyes: Conjunctivae are normal. Right eye exhibits no discharge. Left eye exhibits no discharge. No scleral icterus.  Neck: Normal range of motion. Neck supple. No JVD present. No tracheal deviation present. No thyromegaly present.  Cardiovascular: Normal rate, regular rhythm, normal heart sounds and intact distal pulses.  Exam reveals no gallop and no friction rub.   No murmur heard. Pulmonary/Chest: Effort normal and breath sounds normal. No stridor. No respiratory distress. He has no wheezes. He has no rales. He exhibits no tenderness.  Abdominal: Soft. Bowel sounds are normal. He exhibits no distension and no mass. There is no tenderness. There is no rebound and no  guarding. Hernia confirmed negative in the right inguinal area and confirmed negative in the left inguinal area.  Genitourinary: Rectum normal, prostate normal and testes normal. Rectal exam shows no external hemorrhoid, no internal hemorrhoid, no fissure, no mass, no tenderness, anal tone normal and guaiac negative stool. Prostate is not enlarged and not tender. Right testis shows no mass, no swelling and no tenderness. Right testis is descended. Left  testis shows no mass, no swelling and no tenderness. Left testis is descended. Circumcised. No penile erythema or penile tenderness. No discharge found.  Musculoskeletal: He exhibits no edema or deformity.       Right shoulder: He exhibits decreased range of motion and tenderness (over the posterior shoulder joint). He exhibits no bony tenderness, no swelling, no effusion, no crepitus, no deformity, no pain, no spasm and normal strength.  Lymphadenopathy:    He has no cervical adenopathy.       Right: No inguinal adenopathy present.       Left: No inguinal adenopathy present.  Neurological: He is oriented to person, place, and time.  Skin: Skin is warm and dry. No rash noted. He is not diaphoretic. No erythema. No pallor.  Psychiatric: He has a normal mood and affect. His behavior is normal. Judgment and thought content normal.  Vitals reviewed.   Lab Results  Component Value Date   WBC 7.2 11/22/2016   HGB 14.1 11/22/2016   HCT 40.2 11/22/2016   PLT 314.0 11/22/2016   GLUCOSE 104 (H) 11/22/2016   CHOL 129 11/22/2016   TRIG 77.0 11/22/2016   HDL 44.50 11/22/2016   LDLDIRECT 136.1 09/11/2010   LDLCALC 69 11/22/2016   ALT 32 11/22/2016   AST 18 11/22/2016   NA 141 11/22/2016   K 3.9 11/22/2016   CL 104 11/22/2016   CREATININE 1.09 11/22/2016   BUN 16 11/22/2016   CO2 31 11/22/2016   TSH 0.84 11/22/2016   PSA 0.65 11/22/2016   HGBA1C 5.4 05/11/2016    Dg Chest 2 View  Result Date: 11/07/2015 CLINICAL DATA:  Three weeks of cough and chest congestion, nonsmoker EXAM: CHEST  2 VIEW COMPARISON:  None in PACs FINDINGS: The lungs are well-expanded and clear. The heart and pulmonary vascularity are normal. The mediastinum is normal in width. There is no pleural effusion. The bony thorax exhibits no acute abnormality. IMPRESSION: Mild hyperinflation may be voluntary or may reflect reactive airway disease or acute bronchitis. There is no alveolar pneumonia. Electronically Signed   By:  Cory  Joyce M.D.   On: 11/07/2015 14:42    Assessment & Plan:   Cory Joyce was seen today for annual exam.  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 -     Ambulatory referral to Orthopedic Surgery   I have discontinued Mr. Rench Naftifine HCl and Vitamin D (Cholecalciferol). I am also having him start on etodolac. Additionally, I am having him maintain his aspirin, methylphenidate, atorvastatin, and atomoxetine.  Meds ordered this encounter  Medications  . atomoxetine (STRATTERA) 100 MG capsule    Sig: Take 100 mg by mouth daily.    Refill:  6  . etodolac (LODINE XL) 400 MG 24 hr tablet    Sig: Take 1 tablet (400 mg total) by mouth daily.    Dispense:  90 tablet    Refill:  1     Follow-up: Return if symptoms worsen or fail to improve.  Scarlette Calico, MD

## 2016-11-23 ENCOUNTER — Ambulatory Visit (INDEPENDENT_AMBULATORY_CARE_PROVIDER_SITE_OTHER)
Admission: RE | Admit: 2016-11-23 | Discharge: 2016-11-23 | Disposition: A | Discharge status: Home / Self Care | Payer: BLUE CROSS/BLUE SHIELD | Source: Ambulatory Visit | Attending: Internal Medicine | Admitting: Internal Medicine

## 2016-11-23 ENCOUNTER — Encounter: Payer: Self-pay | Admitting: Internal Medicine

## 2016-11-23 DIAGNOSIS — M25511 Pain in right shoulder: Secondary | ICD-10-CM | POA: Diagnosis not present

## 2016-11-23 DIAGNOSIS — G8929 Other chronic pain: Secondary | ICD-10-CM

## 2016-11-23 LAB — COMPREHENSIVE METABOLIC PANEL
ALBUMIN: 4.5 g/dL (ref 3.5–5.2)
ALK PHOS: 76 U/L (ref 39–117)
ALT: 32 U/L (ref 0–53)
AST: 18 U/L (ref 0–37)
BILIRUBIN TOTAL: 0.5 mg/dL (ref 0.2–1.2)
BUN: 16 mg/dL (ref 6–23)
CALCIUM: 9.5 mg/dL (ref 8.4–10.5)
CO2: 31 mEq/L (ref 19–32)
CREATININE: 1.09 mg/dL (ref 0.40–1.50)
Chloride: 104 mEq/L (ref 96–112)
GFR: 73.55 mL/min (ref >60.00)
Glucose, Bld: 104 mg/dL — ABNORMAL HIGH (ref 70–99)
Potassium: 3.9 mEq/L (ref 3.5–5.1)
Sodium: 141 mEq/L (ref 135–145)
TOTAL PROTEIN: 6.6 g/dL (ref 6.0–8.3)

## 2016-11-23 LAB — URINALYSIS, ROUTINE W REFLEX MICROSCOPIC
BILIRUBIN URINE: NEGATIVE
Hgb urine dipstick: NEGATIVE
KETONES UR: NEGATIVE
LEUKOCYTES UA: NEGATIVE
NITRITE: NEGATIVE
PH: 6 (ref 5.0–8.0)
Specific Gravity, Urine: 1.02 (ref 1.000–1.030)
Total Protein, Urine: NEGATIVE
URINE GLUCOSE: NEGATIVE
Urobilinogen, UA: 0.2 (ref 0.0–1.0)
WBC, UA: NONE SEEN — AB (ref 0–?)

## 2016-11-23 LAB — LIPID PANEL
CHOLESTEROL: 129 mg/dL (ref 0–200)
HDL: 44.5 mg/dL (ref >39.00)
LDL CALC: 69 mg/dL (ref 0–99)
NonHDL: 84.43
TRIGLYCERIDES: 77 mg/dL (ref 0.0–149.0)
Total CHOL/HDL Ratio: 3
VLDL: 15.4 mg/dL (ref 0.0–40.0)

## 2016-11-23 LAB — TSH: TSH: 0.84 u[IU]/mL (ref 0.35–4.50)

## 2016-11-23 LAB — HIV ANTIBODY (ROUTINE TESTING W REFLEX): HIV: NONREACTIVE

## 2016-11-23 LAB — PSA: PSA: 0.65 ng/mL (ref 0.10–4.00)

## 2016-11-24 ENCOUNTER — Encounter: Payer: Self-pay | Admitting: Internal Medicine

## 2016-12-16 ENCOUNTER — Ambulatory Visit (INDEPENDENT_AMBULATORY_CARE_PROVIDER_SITE_OTHER): Payer: BLUE CROSS/BLUE SHIELD | Admitting: Orthopedic Surgery

## 2016-12-16 ENCOUNTER — Encounter (INDEPENDENT_AMBULATORY_CARE_PROVIDER_SITE_OTHER): Payer: Self-pay | Admitting: Orthopedic Surgery

## 2016-12-16 DIAGNOSIS — M25511 Pain in right shoulder: Secondary | ICD-10-CM | POA: Diagnosis not present

## 2016-12-16 NOTE — Progress Notes (Signed)
Office Visit Note   Patient: Cory Joyce           Date of Birth: January 03, 1956           MRN: [id number] Visit Date: 12/16/2016 Requested by: Janith Lima, MD [address] [place], [place] [address] PCP: Scarlette Calico, MD  Subjective: Chief Complaint  Patient presents with  . Right Shoulder - Pain    HPI: Cory Joyce is a 61 year old patient with right shoulder pain.  Denies any history of injury.  Insidious onset of pain started over 6 weeks ago.  The pain comes and goes and its cause by certain movements which are sudden.  This causes a sharp deep pain in the shoulder.  He's taking ibuprofen.  He describes decreased range of motion of the shoulder.  He does computer work.  He does report having a previous clavicle fracture 7 years ago.  It is hard for him to put on his coat.  He cannot sleep on the right-hand side and the pain will wake him from sleep at night.  He is able to do below shoulder level activities such as shoveling mulch but anything overhead and with extension is painful.  He's had radiographs which by his report are okay.              ROS: All systems reviewed are negative as they relate to the chief complaint within the history of present illness.  Patient denies  fevers or chills.   Assessment & Plan: Visit Diagnoses:  1. Acute pain of right shoulder     Plan: Impression is right shoulder pain insidious onset with diminished passive range of motion which is consistent with frozen shoulder.  This frozen shoulder is early.  Plan today is injection into the glenohumeral joint with physical therapy to begin and I'll see him back in 6 weeks for clinical recheck.  Follow-Up Instructions: No Follow-up on file.   Orders:  No orders of the defined types were placed in this encounter.  No orders of the defined types were placed in this encounter.     Procedures: Large Joint Inj Date/Time: 12/16/2016 5:12 PM Performed by: Meredith Pel Authorized  by: Meredith Pel   Consent Given by:  Patient Site marked: the procedure site was marked   Timeout: prior to procedure the correct patient, procedure, and site was verified   Indications:  Pain and diagnostic evaluation Location:  Shoulder Site:  R glenohumeral Prep: patient was prepped and draped in usual sterile fashion   Needle Size:  18 G Needle Length:  1.5 inches Approach:  Posterior Ultrasound Guidance: No   Fluoroscopic Guidance: No   Arthrogram: No   Medications:  5 mL lidocaine 1 %; 9 mL bupivacaine 0.5 %; 40 mg methylPREDNISolone acetate 40 MG/ML Aspiration Attempted: No   Patient tolerance:  Patient tolerated the procedure well with no immediate complications     Clinical Data: No additional findings.  Objective: Vital Signs: There were no vitals taken for this visit.  Physical Exam:   Constitutional: Patient appears well-developed HEENT:  Head: Normocephalic Eyes:EOM are normal Neck: Normal range of motion Cardiovascular: Normal rate Pulmonary/chest: Effort normal Neurologic: Patient is alert Skin: Skin is warm Psychiatric: Patient has normal mood and affect    Ortho Exam: Orthopedic exam demonstrates full active motion of the neck.  5 out of 5 grip EPL FPL interosseous wrist flexion-extension biceps triceps and deltoid strength.  There is no rotator cuff weakness  on the right-hand side but there is restricted passive range of motion to external rotation for flexion and isolated glenohumeral abduction and right versus left.  No course grinding or crepitus in the right shoulder.  No tenderness to palpation of the acromioclavicular joint.  Reflexes symmetric.  Radial pulses intact.  Specialty Comments:  No specialty comments available.  Imaging: No results found.   PMFS History: Patient Active Problem List   Diagnosis Date Noted  . Chronic right shoulder pain 11/22/2016  . Hypersomnia with sleep apnea 06/02/2016  . Hyperglycemia 05/11/2016    . Snoring 05/11/2016  . BCC (basal cell carcinoma of skin) 12/01/2015  . Keratosis of plantar aspect of foot 12/01/2015  . Hyperlipidemia with target LDL less than 130 12/01/2015  . Routine general medical examination at a health care facility 07/01/2014  . Personal history of colonic polyps 09/22/2010   Past Medical History:  Diagnosis Date  . ADHD (attention deficit hyperactivity disorder)   . BCC (basal cell carcinoma), arm    left forearm 10 years ago  . Hyperlipidemia    borderline but no treatment  . Personal history of colonic polyps 09/22/2010  . Right clavicle fracture Jan 2011  . Sleep apnea    tried but unsuccesssful for c pap    Family History  Problem Relation Age of Onset  . Prostate cancer Father   . Alcohol abuse Father   . Colon polyps Father   . Cancer Neg Hx   . Diabetes Neg Hx   . Drug abuse Neg Hx   . Early death Neg Hx   . Heart disease Neg Hx   . Hyperlipidemia Neg Hx   . Hypertension Neg Hx   . Kidney disease Neg Hx   . Stroke Neg Hx   . Colon cancer Neg Hx   . Esophageal cancer Neg Hx   . Rectal cancer Neg Hx   . Stomach cancer Neg Hx     Past Surgical History:  Procedure Laterality Date  . 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 Smoking status: Never Smoker  . Smokeless tobacco: Never Used  . Alcohol use 8.4 oz/week    14 Cans of beer per week     Comment: several a week   . Drug use: No  . Sexual activity: Yes

## 2016-12-17 ENCOUNTER — Ambulatory Visit (INDEPENDENT_AMBULATORY_CARE_PROVIDER_SITE_OTHER): Payer: Self-pay | Admitting: Orthopedic Surgery

## 2016-12-21 ENCOUNTER — Other Ambulatory Visit: Payer: Self-pay | Admitting: Internal Medicine

## 2016-12-21 ENCOUNTER — Telehealth: Payer: Self-pay | Admitting: Internal Medicine

## 2016-12-21 ENCOUNTER — Encounter: Payer: Self-pay | Admitting: Internal Medicine

## 2016-12-21 DIAGNOSIS — Z20828 Contact with and (suspected) exposure to other viral communicable diseases: Secondary | ICD-10-CM | POA: Insufficient documentation

## 2016-12-21 MED ORDER — OSELTAMIVIR PHOSPHATE 75 MG PO CAPS: 75.0000 mg | ORAL_CAPSULE | Freq: Every day | ORAL | 0 refills | Status: AC

## 2016-12-21 NOTE — Telephone Encounter (Signed)
Patient states his wife was diagnosed with the flu today.  Is requesting tamiflu to be sent to [facility] on [address].

## 2016-12-22 NOTE — Telephone Encounter (Signed)
Routing to dr jones, please advise, thanks 

## 2016-12-22 NOTE — Telephone Encounter (Signed)
This was taken care of yesterday

## 2016-12-24 MED ORDER — METHYLPREDNISOLONE ACETATE 40 MG/ML IJ SUSP
40.0000 mg | INTRAMUSCULAR | Status: AC | PRN
  Administered 2016-12-16: 40 mg via INTRA_ARTICULAR

## 2016-12-24 MED ORDER — BUPIVACAINE HCL 0.5 % IJ SOLN
9.0000 mL | INTRAMUSCULAR | Status: AC | PRN
  Administered 2016-12-16: 9 mL via INTRA_ARTICULAR

## 2016-12-24 MED ORDER — LIDOCAINE HCL 1 % IJ SOLN
5.0000 mL | INTRAMUSCULAR | Status: AC | PRN
  Administered 2016-12-16: 5 mL

## 2016-12-29 ENCOUNTER — Encounter: Payer: Self-pay | Admitting: Pulmonary Disease

## 2016-12-29 ENCOUNTER — Ambulatory Visit (INDEPENDENT_AMBULATORY_CARE_PROVIDER_SITE_OTHER): Payer: BLUE CROSS/BLUE SHIELD | Admitting: Pulmonary Disease

## 2016-12-29 VITALS — BP 126/76 | HR 88 | Ht 70.0 in | Wt 191.2 lb

## 2016-12-29 DIAGNOSIS — F909 Attention-deficit hyperactivity disorder, unspecified type: Secondary | ICD-10-CM | POA: Diagnosis not present

## 2016-12-29 DIAGNOSIS — G4733 Obstructive sleep apnea (adult) (pediatric): Secondary | ICD-10-CM

## 2016-12-29 NOTE — Assessment & Plan Note (Signed)
Patient with hypersomnia, snoring, witnessed apneas. He has ADHD for which he is on Ritalin, 20 mg 3 times a day. He had mild sleep apnea in 2004 for which he was on CPAP therapy but he stopped it as he did not feel benefit off it. His symptoms worsen. He had a sleep study in October 2017. Started on CPAP 11 cm water. He feels better using CPAP, more energy, less sleepiness. He has nasal pillows and has leaks waking him up in the middle of the night.  Download the last month: 77%, AHI 3.1. CPAP 11.  Plan : We extensively discussed the importance of treating OSA and the need to use PAP therapy.   Continue with CPAP therapy. As he's having leak issues with nasal pillows, we will try to decrease his pressure to 9 cm water. We will get a download on 9 cm water. We will schedule him to have a mask fitting session with Lynnae Sandhoff. While awaiting that, he will continue with his current nasal pillows. I asked him to let us know once he has been mask fit with Lynnae Sandhoff.   Regarding his Ritalin, I suggested that he tries to cut it down  per  his attention deficit doctor.   Patient was instructed to have mask, tubings, filter, reservoir cleaned at least once a week with soapy water.  Patient was instructed to call the office if he/she is having issues with the PAP device.    I advised patient to obtain sufficient amount of sleep --  7 to 8 hours at least in a 24 hr period.  Patient was advised to follow good sleep hygiene.  Patient was advised NOT to engage in activities requiring concentration and/or vigilance if he/she is and  sleepy.  Patient is NOT to drive if he/she is sleepy.

## 2016-12-29 NOTE — Patient Instructions (Signed)
  It was a pleasure taking care of you today!  Continue using your CPAP machine. We will schedule you for a mask fitting session with Lynnae Sandhoff. We will try to decrease your pressure to 9 cm water and get a download.  Please make sure you use your CPAP device everytime you sleep.  We will monitor the usage of your machine per your insurance requirement.  Your insurance company may take the machine from you if you are not using it regularly.   Please clean the mask, tubings, filter, water reservoir with soapy water every week.  Please use distilled water for the water reservoir.   Please call the office or your machine provider (DME company) if you are having issues with the device.   Return to clinic in 1 year with NP

## 2016-12-29 NOTE — Progress Notes (Signed)
Subjective:    Patient ID: Cory Joyce, male    DOB: 17-Sep-1956, 61 y.o.   MRN: [id number]   Markes Shatswell is a 61 year old male never smoker with previously diagnosed ( 2004) moderate sleep apnea  (Seen by Dr. Gwenette Greet). He wore CPAP x 6 months, and then stopped. He is here for re-evaluation at the request of his psychiatrist Dr. Clovis Pu.  HPI Pt. Presents to the office for consultation for sleep apnea. He was seen in 2004 by Dr. Gwenette Greet and had a sleep test, and used a CPAP device for 6-8 months.His sleep apnea was diagnosed as mild, he was given the option to stop treatment, which he did. He has not had any significant problems since then. He has recently purchased a bed that allows him to elevate his head to prevent snoring.He goes to bed each night between 11 pm and 12 am. He awakens around 6 am. He falls asleep within 5-10 minutes of going to bed.He has ADHD and is on Methylphenidate.daily, 20 mg TID. He has been taking the Methylphenidate since 1998. He takes his medication at 6 am, 11 and 4 pm. The medication is out of his system prior to bed.He has had a 5 pound weight gain in the last 2 years.due to reduced exercise levels.He does not have daytime sleepiness on his Methylphenidate.He is a never smoker. He does have one alcoholic beverage about once daily. Either beer or wine, and occasionally scotch.Epworth Score is 16. The patient's psychiatrist, Dr. Benay Spice has suggested that he come in for evaluation. He would also like this information shared with [name], at [facility].  ROV 12/29/2016 patient returns to the office as follow-up on his sleep apnea. Since last seen, he had a sleep study in October 2017 which showed an AHI of 15. He was started on CPAP therapy, currently on 11 cm water. He uses CPAP. Feels better using it. More energy. Less sleepiness. Download the last month: 77%, AHI 30.1. He has nasal pillows which she switched out recently to another  type of nasal pillows. He is having leak issues with her pillows which wake him up at night.  Review of Systems  Constitutional: Negative for fever and unexpected weight change.  HENT: Negative for congestion, dental problem, ear pain, nosebleeds, postnasal drip, rhinorrhea, sinus pressure, sneezing, sore throat and trouble swallowing.   Eyes: Negative for redness and itching.  Respiratory: Negative for cough, chest tightness, shortness of breath and wheezing.   Cardiovascular: Negative for palpitations and leg swelling.  Gastrointestinal: Negative for nausea and vomiting.  Genitourinary: Negative for dysuria.  Musculoskeletal: Negative for joint swelling.  Skin: Negative for rash.  Neurological: Negative for headaches.  Hematological: Does not bruise/bleed easily.  Psychiatric/Behavioral: Negative for dysphoric mood. The patient is not nervous/anxious.        Objective:   Physical Exam BP 126/76 (BP Location: Left Arm, Patient Position: Sitting, Cuff Size: Normal)   Pulse 88   Ht 5\' 10"  (1.778 m)   Wt 191 lb 3.2 oz (86.7 kg)   SpO2 98%   BMI 27.43 kg/m    Vitals:  Vitals:   12/29/16 0902  BP: 126/76  Pulse: 88  SpO2: 98%  Weight: 191 lb 3.2 oz (86.7 kg)  Height: 5\' 10"  (1.778 m)    Constitutional/General:  Pleasant, well-nourished, well-developed, not in any distress,  Comfortably seating.  Well kempt  Body mass index is 27.43 kg/m. Wt Readings from Last 3 Encounters:  12/29/16 191  lb 3.2 oz (86.7 kg)  11/22/16 187 lb 8 oz (85 kg)  09/13/16 187 lb 6.4 oz (85 kg)     HEENT: Pupils equal and reactive to light and accommodation. Anicteric sclerae. Normal nasal mucosa.   No oral  lesions,  mouth clear,  oropharynx clear, no postnasal drip. (-) Oral thrush. No dental caries.  Airway - Mallampati class III  Neck: No masses. Midline trachea. No JVD, (-) LAD. (-) bruits appreciated.  Respiratory/Chest: Grossly normal chest. (-) deformity. (-) Accessory muscle use.    Symmetric expansion. (-) Tenderness on palpation.  Resonant on percussion.  Diminished BS on both lower lung zones. (-) wheezing, crackles, rhonchi (-) egophony  Cardiovascular: Regular rate and  rhythm, heart sounds normal, no murmur or gallops, no peripheral edema  Gastrointestinal:  Normal bowel sounds. Soft, non-tender. No hepatosplenomegaly.  (-) masses.   Musculoskeletal:  Normal muscle tone. Normal gait.   Extremities: Grossly normal. (-) clubbing, cyanosis.  (-) edema  Skin: (-) rash,lesions seen.   Neurological/Psychiatric : alert, oriented to time, place, person. Normal mood and affect    Plan: OSA (obstructive sleep apnea) Patient with hypersomnia, snoring, witnessed apneas. He has ADHD for which he is on Ritalin, 20 mg 3 times a day. He had mild sleep apnea in 2004 for which he was on CPAP therapy but he stopped it as he did not feel benefit off it. His symptoms worsen. He had a sleep study in October 2017. Started on CPAP 11 cm water. He feels better using CPAP, more energy, less sleepiness. He has nasal pillows and has leaks waking him up in the middle of the night.  Download the last month: 77%, AHI 3.1. CPAP 11.  Plan : We extensively discussed the importance of treating OSA and the need to use PAP therapy.   Continue with CPAP therapy. As he's having leak issues with nasal pillows, we will try to decrease his pressure to 9 cm water. We will get a download on 9 cm water. We will schedule him to have a mask fitting session with Lynnae Sandhoff. While awaiting that, he will continue with his current nasal pillows. I asked him to let us know once he has been mask fit with Lynnae Sandhoff.   Regarding his Ritalin, I suggested that he tries to cut it down  per  his attention deficit doctor.   Patient was instructed to have mask, tubings, filter, reservoir cleaned at least once a week with soapy water.  Patient was instructed to call the office if he/she is having issues with the  PAP device.    I advised patient to obtain sufficient amount of sleep --  7 to 8 hours at least in a 24 hr period.  Patient was advised to follow good sleep hygiene.  Patient was advised NOT to engage in activities requiring concentration and/or vigilance if he/she is and  sleepy.  Patient is NOT to drive if he/she is sleepy.   Attention deficit hyperactivity disorder (ADHD) Patient sees [name] at [facility] for his ADHD. He is currently on Ritalin, 20 mg 3 times a day.   Return to clinic in 1 year.    Monica Becton, MD 12/29/2016, 9:47 AM [facility] Pager [phone] After 3 pm or if no answer, call [phone]

## 2016-12-29 NOTE — Assessment & Plan Note (Signed)
Patient sees Cory Joyce at [facility] for his ADHD. He is currently on Ritalin, 20 mg 3 times a day.

## 2017-01-05 ENCOUNTER — Ambulatory Visit (HOSPITAL_BASED_OUTPATIENT_CLINIC_OR_DEPARTMENT_OTHER): Payer: BLUE CROSS/BLUE SHIELD | Attending: Pulmonary Disease | Admitting: Radiology

## 2017-01-05 DIAGNOSIS — G4733 Obstructive sleep apnea (adult) (pediatric): Secondary | ICD-10-CM

## 2017-01-06 ENCOUNTER — Encounter: Payer: Self-pay | Admitting: Pulmonary Disease

## 2017-01-06 DIAGNOSIS — G4733 Obstructive sleep apnea (adult) (pediatric): Secondary | ICD-10-CM

## 2017-01-27 ENCOUNTER — Encounter (INDEPENDENT_AMBULATORY_CARE_PROVIDER_SITE_OTHER): Payer: Self-pay | Admitting: Orthopedic Surgery

## 2017-01-27 ENCOUNTER — Ambulatory Visit (INDEPENDENT_AMBULATORY_CARE_PROVIDER_SITE_OTHER): Payer: BLUE CROSS/BLUE SHIELD | Admitting: Orthopedic Surgery

## 2017-01-27 DIAGNOSIS — M25511 Pain in right shoulder: Secondary | ICD-10-CM | POA: Diagnosis not present

## 2017-01-27 NOTE — Progress Notes (Signed)
Office Visit Note   Patient: Cory Joyce           Date of Birth: 19-Oct-1955           MRN: [id number] Visit Date: 01/27/2017 Requested by: Janith Lima, MD [address] [place], [place] [address] PCP: Scarlette Calico, MD  Subjective: Chief Complaint  Patient presents with  . Right Shoulder - Follow-up    HPI: Cory Joyce is a 61 year old patient with right shoulder pain and frozen shoulder.  Had glenohumeral joint injection March 2018 physical therapy started at that time as well.  He is doing much better.  He states his range of motion is improving.  Reports decreasing pain.  He is an breakthrough physical therapy doing stretching and manual physical therapy along with him exercise program.  He can through the tennis ball to his dog at this time.  He has not taken any anti-inflammatories for 2 weeks              ROS: All systems reviewed are negative as they relate to the chief complaint within the history of present illness.  Patient denies  fevers or chills.   Assessment & Plan: Visit Diagnoses:  1. Right shoulder pain, unspecified chronicity     Plan: Impression is improvement in motion and diminished pain following conservative treatment for right frozen shoulder.  Plan is to continue this treatment regimen of therapy home exercise program and periodic anti-inflammatories.  His motion is improved significantly.  Pain is also improved.  All see him back as needed  Follow-Up Instructions: Return if symptoms worsen or fail to improve.   Orders:  No orders of the defined types were placed in this encounter.  No orders of the defined types were placed in this encounter.     Procedures: No procedures performed   Clinical Data: No additional findings.  Objective: Vital Signs: There were no vitals taken for this visit.  Physical Exam:   Constitutional: Patient appears well-developed HEENT:  Head: Normocephalic Eyes:EOM are normal Neck: Normal  range of motion Cardiovascular: Normal rate Pulmonary/chest: Effort normal Neurologic: Patient is alert Skin: Skin is warm Psychiatric: Patient has normal mood and affect    Ortho Exam: Orthopedic exam demonstrates good cervical spine range of motion good motor sensory function in the right arm 5 out of 5 grip EPL FPL interosseous wrist flexion-extension biceps triceps and deltoid strength.  Has 170 of forward flexion about 95 of glenohumeral abduction on the right-hand side with good rotator cuff strength and no course grinding or crepitus with active or passive range of motion of the shoulder.  Has about 2 vertebral body height difference in internal rotation left versus right.  Specialty Comments:  No specialty comments available.  Imaging: No results found.   PMFS History: Patient Active Problem List   Diagnosis Date Noted  . OSA (obstructive sleep apnea) 12/29/2016  . Attention deficit hyperactivity disorder (ADHD) 12/29/2016  . Exposure to the flu 12/21/2016  . Chronic right shoulder pain 11/22/2016  . Hypersomnia with sleep apnea 06/02/2016  . Hyperglycemia 05/11/2016  . Snoring 05/11/2016  . BCC (basal cell carcinoma of skin) 12/01/2015  . Keratosis of plantar aspect of foot 12/01/2015  . Hyperlipidemia with target LDL less than 130 12/01/2015  . Routine general medical examination at a health care facility 07/01/2014  . Personal history of colonic polyps 09/22/2010   Past Medical History:  Diagnosis Date  . ADHD (attention deficit hyperactivity disorder)   .  BCC (basal cell carcinoma), arm    left forearm 10 years ago  . Hyperlipidemia    borderline but no treatment  . Personal history of colonic polyps 09/22/2010  . Right clavicle fracture Jan 2011  . Sleep apnea    tried but unsuccesssful for c pap    Family History  Problem Relation Age of Onset  . Prostate cancer Father   . Alcohol abuse Father   . Colon polyps Father   . Cancer Neg Hx   . Diabetes Neg  Hx   . Drug abuse Neg Hx   . Early death Neg Hx   . Heart disease Neg Hx   . Hyperlipidemia Neg Hx   . Hypertension Neg Hx   . Kidney disease Neg Hx   . Stroke Neg Hx   . Colon cancer Neg Hx   . Esophageal cancer Neg Hx   . Rectal cancer Neg Hx   . Stomach cancer Neg Hx     Past Surgical History:  Procedure Laterality Date  . 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 Smoking status: Never Smoker  . Smokeless tobacco: Never Used  . Alcohol use 8.4 oz/week    14 Cans of beer per week     Comment: several a week   . Drug use: No  . Sexual activity: Yes

## 2017-04-23 ENCOUNTER — Other Ambulatory Visit: Payer: Self-pay | Admitting: Internal Medicine

## 2017-04-23 DIAGNOSIS — E785 Hyperlipidemia, unspecified: Secondary | ICD-10-CM

## 2017-06-02 DIAGNOSIS — F4322 Adjustment disorder with anxiety: Secondary | ICD-10-CM | POA: Diagnosis not present

## 2017-06-10 DIAGNOSIS — G4733 Obstructive sleep apnea (adult) (pediatric): Secondary | ICD-10-CM | POA: Diagnosis not present

## 2017-06-20 DIAGNOSIS — M5137 Other intervertebral disc degeneration, lumbosacral region: Secondary | ICD-10-CM | POA: Diagnosis not present

## 2017-06-20 DIAGNOSIS — M542 Cervicalgia: Secondary | ICD-10-CM | POA: Diagnosis not present

## 2017-06-20 DIAGNOSIS — M5441 Lumbago with sciatica, right side: Secondary | ICD-10-CM | POA: Diagnosis not present

## 2017-06-20 DIAGNOSIS — M9903 Segmental and somatic dysfunction of lumbar region: Secondary | ICD-10-CM | POA: Diagnosis not present

## 2017-06-23 DIAGNOSIS — F4322 Adjustment disorder with anxiety: Secondary | ICD-10-CM | POA: Diagnosis not present

## 2017-06-28 DIAGNOSIS — F900 Attention-deficit hyperactivity disorder, predominantly inattentive type: Secondary | ICD-10-CM | POA: Diagnosis not present

## 2017-06-30 DIAGNOSIS — F4322 Adjustment disorder with anxiety: Secondary | ICD-10-CM | POA: Diagnosis not present

## 2017-07-05 DIAGNOSIS — F900 Attention-deficit hyperactivity disorder, predominantly inattentive type: Secondary | ICD-10-CM | POA: Diagnosis not present

## 2017-07-07 DIAGNOSIS — F4322 Adjustment disorder with anxiety: Secondary | ICD-10-CM | POA: Diagnosis not present

## 2017-07-08 DIAGNOSIS — F419 Anxiety disorder, unspecified: Secondary | ICD-10-CM | POA: Diagnosis not present

## 2017-07-08 DIAGNOSIS — F902 Attention-deficit hyperactivity disorder, combined type: Secondary | ICD-10-CM | POA: Diagnosis not present

## 2017-07-08 DIAGNOSIS — Z79899 Other long term (current) drug therapy: Secondary | ICD-10-CM | POA: Diagnosis not present

## 2017-07-08 DIAGNOSIS — G4733 Obstructive sleep apnea (adult) (pediatric): Secondary | ICD-10-CM | POA: Diagnosis not present

## 2017-07-12 DIAGNOSIS — F900 Attention-deficit hyperactivity disorder, predominantly inattentive type: Secondary | ICD-10-CM | POA: Diagnosis not present

## 2017-07-14 DIAGNOSIS — F4322 Adjustment disorder with anxiety: Secondary | ICD-10-CM | POA: Diagnosis not present

## 2017-07-20 DIAGNOSIS — M542 Cervicalgia: Secondary | ICD-10-CM | POA: Diagnosis not present

## 2017-07-20 DIAGNOSIS — M5137 Other intervertebral disc degeneration, lumbosacral region: Secondary | ICD-10-CM | POA: Diagnosis not present

## 2017-07-20 DIAGNOSIS — M5441 Lumbago with sciatica, right side: Secondary | ICD-10-CM | POA: Diagnosis not present

## 2017-07-20 DIAGNOSIS — M9903 Segmental and somatic dysfunction of lumbar region: Secondary | ICD-10-CM | POA: Diagnosis not present

## 2017-07-21 DIAGNOSIS — F4322 Adjustment disorder with anxiety: Secondary | ICD-10-CM | POA: Diagnosis not present

## 2017-07-26 DIAGNOSIS — F900 Attention-deficit hyperactivity disorder, predominantly inattentive type: Secondary | ICD-10-CM | POA: Diagnosis not present

## 2017-07-28 DIAGNOSIS — F4322 Adjustment disorder with anxiety: Secondary | ICD-10-CM | POA: Diagnosis not present

## 2017-08-04 DIAGNOSIS — F4322 Adjustment disorder with anxiety: Secondary | ICD-10-CM | POA: Diagnosis not present

## 2017-08-16 DIAGNOSIS — M9903 Segmental and somatic dysfunction of lumbar region: Secondary | ICD-10-CM | POA: Diagnosis not present

## 2017-08-16 DIAGNOSIS — M542 Cervicalgia: Secondary | ICD-10-CM | POA: Diagnosis not present

## 2017-08-16 DIAGNOSIS — F900 Attention-deficit hyperactivity disorder, predominantly inattentive type: Secondary | ICD-10-CM | POA: Diagnosis not present

## 2017-08-16 DIAGNOSIS — M5137 Other intervertebral disc degeneration, lumbosacral region: Secondary | ICD-10-CM | POA: Diagnosis not present

## 2017-08-16 DIAGNOSIS — M5441 Lumbago with sciatica, right side: Secondary | ICD-10-CM | POA: Diagnosis not present

## 2017-08-23 DIAGNOSIS — F900 Attention-deficit hyperactivity disorder, predominantly inattentive type: Secondary | ICD-10-CM | POA: Diagnosis not present

## 2017-08-25 DIAGNOSIS — F4322 Adjustment disorder with anxiety: Secondary | ICD-10-CM | POA: Diagnosis not present

## 2017-08-29 ENCOUNTER — Ambulatory Visit (INDEPENDENT_AMBULATORY_CARE_PROVIDER_SITE_OTHER): Payer: BLUE CROSS/BLUE SHIELD | Admitting: Orthopedic Surgery

## 2017-08-29 ENCOUNTER — Encounter (INDEPENDENT_AMBULATORY_CARE_PROVIDER_SITE_OTHER): Payer: Self-pay | Admitting: Orthopedic Surgery

## 2017-08-29 DIAGNOSIS — G8929 Other chronic pain: Secondary | ICD-10-CM

## 2017-08-29 DIAGNOSIS — M25511 Pain in right shoulder: Secondary | ICD-10-CM

## 2017-08-30 DIAGNOSIS — F900 Attention-deficit hyperactivity disorder, predominantly inattentive type: Secondary | ICD-10-CM | POA: Diagnosis not present

## 2017-09-01 DIAGNOSIS — F4322 Adjustment disorder with anxiety: Secondary | ICD-10-CM | POA: Diagnosis not present

## 2017-09-02 NOTE — Progress Notes (Signed)
Office Visit Note   Patient: Cory Joyce           Date of Birth: 1956/06/30           MRN: [id number] Visit Date: 08/29/2017 Requested by: Janith Lima, MD [address], [place] [address] PCP: Janith Lima, MD  Subjective: Chief Complaint  Patient presents with  . Right Shoulder - Pain    HPI: [name] is a patient with right shoulder pain.  I saw him earlier this year with frozen shoulder which improved.  Now he describes several months of symptoms starting when he tried to start a power washer for 15 minutes on Labor Day weekend.  This involved a lot of pulling and pulling and pulling of the starter cord.  He tried ibuprofen which did not help him.  His symptoms have gotten worse.  His shoulder hurts him with sudden movement.  He describes stabbing pain in the anterior deltoid and a tearing sensation.  It is hard for him to sleep on the right-hand side.  He did physical therapy earlier this year for frozen shoulder.  He is tried these exercises for this problem and it has not helped.              ROS: All systems reviewed are negative as they relate to the chief complaint within the history of present illness.  Patient denies  fevers or chills.   Assessment & Plan: Visit Diagnoses:  1. Chronic right shoulder pain     Plan: Impression is right shoulder pain with possible rotator cuff or labral pathology.  His shoulder really does not range of motion deficits.  Sharp sudden shooting nature of the pain makes me think that this could be either biceps tendinitis or a biceps tendon and labral tear.  Plan MRI scan of the right shoulder to evaluate.  I will see him back after that study.  Continue with over-the-counter medications.  Is very difficult for him to do any type of overhead activity  Follow-Up Instructions: Return for after MRI.   Orders:  Orders Placed This Encounter  Procedures  . MR SHOULDER RIGHT W CONTRAST  . Arthrogram   No orders  of the defined types were placed in this encounter.     Procedures: No procedures performed   Clinical Data: No additional findings.  Objective: Vital Signs: There were no vitals taken for this visit.  Physical Exam:   Constitutional: Patient appears well-developed HEENT:  Head: Normocephalic Eyes:EOM are normal Neck: Normal range of motion Cardiovascular: Normal rate Pulmonary/chest: Effort normal Neurologic: Patient is alert Skin: Skin is warm Psychiatric: Patient has normal mood and affect    Ortho Exam: Orthopedic exam demonstrates good cervical spine range of motion no other masses lymphadenopathy or skin changes noted in the right shoulder region.  External rotation at 15 degrees of abduction is pretty symmetric on the right versus left with only slight limitation of internal rotation.  He does have positive O'Brien's testing and some supraspinatus weakness on the right compared to the left.  No discrete AC joint tenderness.  Negative apprehension relocation testing on the right-hand side  Specialty Comments:  No specialty comments available.  Imaging: No results found.   PMFS History: Patient Active Problem List   Diagnosis Date Noted  . OSA (obstructive sleep apnea) 12/29/2016  . Attention deficit hyperactivity disorder (ADHD) 12/29/2016  . Exposure to the flu 12/21/2016  . Chronic right shoulder pain 11/22/2016  .  Hypersomnia with sleep apnea 06/02/2016  . Hyperglycemia 05/11/2016  . Snoring 05/11/2016  . BCC (basal cell carcinoma of skin) 12/01/2015  . Keratosis of plantar aspect of foot 12/01/2015  . Hyperlipidemia with target LDL less than 130 12/01/2015  . Routine general medical examination at a health care facility 07/01/2014  . Personal history of colonic polyps 09/22/2010   Past Medical History:  Diagnosis Date  . ADHD (attention deficit hyperactivity disorder)   . BCC (basal cell carcinoma), arm    left forearm 10 years ago  .  Hyperlipidemia    borderline but no treatment  . Personal history of colonic polyps 09/22/2010  . Right clavicle fracture Jan 2011  . Sleep apnea    tried but unsuccesssful for c pap    Family History  Problem Relation Age of Onset  . Prostate cancer Father   . Alcohol abuse Father   . Colon polyps Father   . Cancer Neg Hx   . Diabetes Neg Hx   . Drug abuse Neg Hx   . Early death Neg Hx   . Heart disease Neg Hx   . Hyperlipidemia Neg Hx   . Hypertension Neg Hx   . Kidney disease Neg Hx   . Stroke Neg Hx   . Colon cancer Neg Hx   . Esophageal cancer Neg Hx   . Rectal cancer Neg Hx   . Stomach cancer Neg Hx     Past Surgical History:  Procedure Laterality Date  . COLONOSCOPY  09-22-2010   Social History   Occupational History  . 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 Smoking status: Never Smoker  . Smokeless tobacco: Never Used  Substance and Sexual Activity  . Alcohol use: Yes    Alcohol/week: 8.4 oz    Types: 14 Cans of beer per week    Comment: several a week   . Drug use: No  . Sexual activity: Yes

## 2017-09-08 DIAGNOSIS — F4322 Adjustment disorder with anxiety: Secondary | ICD-10-CM | POA: Diagnosis not present

## 2017-09-12 DIAGNOSIS — G4733 Obstructive sleep apnea (adult) (pediatric): Secondary | ICD-10-CM | POA: Diagnosis not present

## 2017-09-13 DIAGNOSIS — M5441 Lumbago with sciatica, right side: Secondary | ICD-10-CM | POA: Diagnosis not present

## 2017-09-13 DIAGNOSIS — M9903 Segmental and somatic dysfunction of lumbar region: Secondary | ICD-10-CM | POA: Diagnosis not present

## 2017-09-13 DIAGNOSIS — M542 Cervicalgia: Secondary | ICD-10-CM | POA: Diagnosis not present

## 2017-09-13 DIAGNOSIS — M5137 Other intervertebral disc degeneration, lumbosacral region: Secondary | ICD-10-CM | POA: Diagnosis not present

## 2017-09-13 DIAGNOSIS — F900 Attention-deficit hyperactivity disorder, predominantly inattentive type: Secondary | ICD-10-CM | POA: Diagnosis not present

## 2017-09-15 ENCOUNTER — Ambulatory Visit
Admission: RE | Admit: 2017-09-15 | Discharge: 2017-09-15 | Disposition: A | Discharge status: Home / Self Care | Payer: BLUE CROSS/BLUE SHIELD | Source: Ambulatory Visit | Attending: Orthopedic Surgery | Admitting: Orthopedic Surgery

## 2017-09-15 DIAGNOSIS — S43431A Superior glenoid labrum lesion of right shoulder, initial encounter: Secondary | ICD-10-CM | POA: Diagnosis not present

## 2017-09-15 DIAGNOSIS — G8929 Other chronic pain: Secondary | ICD-10-CM

## 2017-09-15 DIAGNOSIS — M25511 Pain in right shoulder: Principal | ICD-10-CM

## 2017-09-15 DIAGNOSIS — F4322 Adjustment disorder with anxiety: Secondary | ICD-10-CM | POA: Diagnosis not present

## 2017-09-15 MED ORDER — IOPAMIDOL (ISOVUE-M 200) INJECTION 41%
15.0000 mL | Freq: Once | INTRAMUSCULAR | Status: AC
  Administered 2017-09-15: 15 mL via INTRA_ARTICULAR

## 2017-09-29 DIAGNOSIS — F4322 Adjustment disorder with anxiety: Secondary | ICD-10-CM | POA: Diagnosis not present

## 2017-10-03 DIAGNOSIS — H903 Sensorineural hearing loss, bilateral: Secondary | ICD-10-CM | POA: Diagnosis not present

## 2017-10-03 DIAGNOSIS — H9325 Central auditory processing disorder: Secondary | ICD-10-CM | POA: Insufficient documentation

## 2017-10-03 DIAGNOSIS — Z0110 Encounter for examination of ears and hearing without abnormal findings: Secondary | ICD-10-CM | POA: Diagnosis not present

## 2017-10-04 DIAGNOSIS — F900 Attention-deficit hyperactivity disorder, predominantly inattentive type: Secondary | ICD-10-CM | POA: Diagnosis not present

## 2017-10-05 ENCOUNTER — Encounter (INDEPENDENT_AMBULATORY_CARE_PROVIDER_SITE_OTHER): Payer: Self-pay | Admitting: Orthopedic Surgery

## 2017-10-05 ENCOUNTER — Ambulatory Visit (INDEPENDENT_AMBULATORY_CARE_PROVIDER_SITE_OTHER): Payer: BLUE CROSS/BLUE SHIELD | Admitting: Orthopedic Surgery

## 2017-10-05 DIAGNOSIS — M25511 Pain in right shoulder: Secondary | ICD-10-CM

## 2017-10-05 DIAGNOSIS — G8929 Other chronic pain: Secondary | ICD-10-CM

## 2017-10-06 DIAGNOSIS — F4322 Adjustment disorder with anxiety: Secondary | ICD-10-CM | POA: Diagnosis not present

## 2017-10-07 ENCOUNTER — Encounter (INDEPENDENT_AMBULATORY_CARE_PROVIDER_SITE_OTHER): Payer: Self-pay | Admitting: Orthopedic Surgery

## 2017-10-07 NOTE — Progress Notes (Signed)
Office Visit Note   Patient: Cory Joyce           Date of Birth: June 12, 1956           MRN: [id number] Visit Date: 10/05/2017 Requested by: Janith Lima, MD [address], [place] [address] PCP: Janith Lima, MD  Subjective: Chief Complaint  Patient presents with  . Right Shoulder - Follow-up    HPI: Patient presents for follow-up of right shoulder MRI scan.  MRI scan shows very small posterior superior labral tear.  Patient states his shoulder feels tight.  This all started when he was trying to start a lawnmower with 15 repetitive forceful pulls.  Stretching is helped him in the past.  He is an ex-gymnast.  MRI scan shows very minimal labral pathology except for that posterior superior labral tear which is small.              ROS: All systems reviewed are negative as they relate to the chief complaint within the history of present illness.  Patient denies  fevers or chills.   Assessment & Plan: Visit Diagnoses:  1. Chronic right shoulder pain     Plan: Impression is right shoulder pain with questionable labral pathology.  I do not think this really warrants surgical intervention in this patient at this time.  Could consider injection or continued physical therapy.  Patient opts for stretching and deep tissue massage which is helped him in the past.  We will get that arranged and I will see him back as needed.  Follow-Up Instructions: Return if symptoms worsen or fail to improve.   Orders:  No orders of the defined types were placed in this encounter.  No orders of the defined types were placed in this encounter.     Procedures: No procedures performed   Clinical Data: No additional findings.  Objective: Vital Signs: There were no vitals taken for this visit.  Physical Exam:   Constitutional: Patient appears well-developed HEENT:  Head: Normocephalic Eyes:EOM are normal Neck: Normal range of motion Cardiovascular: Normal  rate Pulmonary/chest: Effort normal Neurologic: Patient is alert Skin: Skin is warm Psychiatric: Patient has normal mood and affect    Ortho Exam: Orthopedic exam demonstrates full active and passive range of motion of the right shoulder.  O'Brien's testing generally negative on both sides.  No AC joint tenderness to palpation in the right and left shoulder.  Cervical spine range of motion is full.  Radial pulses intact.  Rotator cuff strength is excellent in the right shoulder to infraspinatus supraspinatus and subscap muscle testing.  Specialty Comments:  No specialty comments available.  Imaging: No results found.   PMFS History: Patient Active Problem List   Diagnosis Date Noted  . OSA (obstructive sleep apnea) 12/29/2016  . Attention deficit hyperactivity disorder (ADHD) 12/29/2016  . Exposure to the flu 12/21/2016  . Chronic right shoulder pain 11/22/2016  . Hypersomnia with sleep apnea 06/02/2016  . Hyperglycemia 05/11/2016  . Snoring 05/11/2016  . BCC (basal cell carcinoma of skin) 12/01/2015  . Keratosis of plantar aspect of foot 12/01/2015  . Hyperlipidemia with target LDL less than 130 12/01/2015  . Routine general medical examination at a health care facility 07/01/2014  . Personal history of colonic polyps 09/22/2010   Past Medical History:  Diagnosis Date  . ADHD (attention deficit hyperactivity disorder)   . BCC (basal cell carcinoma), arm    left forearm 10 years ago  . Hyperlipidemia  borderline but no treatment  . Personal history of colonic polyps 09/22/2010  . Right clavicle fracture Jan 2011  . Sleep apnea    tried but unsuccesssful for c pap    Family History  Problem Relation Age of Onset  . Prostate cancer Father   . Alcohol abuse Father   . Colon polyps Father   . Cancer Neg Hx   . Diabetes Neg Hx   . Drug abuse Neg Hx   . Early death Neg Hx   . Heart disease Neg Hx   . Hyperlipidemia Neg Hx   . Hypertension Neg Hx   . Kidney disease  Neg Hx   . Stroke Neg Hx   . Colon cancer Neg Hx   . Esophageal cancer Neg Hx   . Rectal cancer Neg Hx   . Stomach cancer Neg Hx     Past Surgical History:  Procedure Laterality Date  . COLONOSCOPY  09-22-2010   Social History   Occupational History  . 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 Smoking status: Never Smoker  . Smokeless tobacco: Never Used  Substance and Sexual Activity  . Alcohol use: Yes    Alcohol/week: 8.4 oz    Types: 14 Cans of beer per week    Comment: several a week   . Drug use: No  . Sexual activity: Yes

## 2017-10-11 DIAGNOSIS — M542 Cervicalgia: Secondary | ICD-10-CM | POA: Diagnosis not present

## 2017-10-11 DIAGNOSIS — M5137 Other intervertebral disc degeneration, lumbosacral region: Secondary | ICD-10-CM | POA: Diagnosis not present

## 2017-10-11 DIAGNOSIS — M5441 Lumbago with sciatica, right side: Secondary | ICD-10-CM | POA: Diagnosis not present

## 2017-10-11 DIAGNOSIS — F900 Attention-deficit hyperactivity disorder, predominantly inattentive type: Secondary | ICD-10-CM | POA: Diagnosis not present

## 2017-10-11 DIAGNOSIS — M9903 Segmental and somatic dysfunction of lumbar region: Secondary | ICD-10-CM | POA: Diagnosis not present

## 2017-10-12 DIAGNOSIS — M25611 Stiffness of right shoulder, not elsewhere classified: Secondary | ICD-10-CM | POA: Diagnosis not present

## 2017-10-12 DIAGNOSIS — F902 Attention-deficit hyperactivity disorder, combined type: Secondary | ICD-10-CM | POA: Diagnosis not present

## 2017-10-12 DIAGNOSIS — Z79899 Other long term (current) drug therapy: Secondary | ICD-10-CM | POA: Diagnosis not present

## 2017-10-12 DIAGNOSIS — G4733 Obstructive sleep apnea (adult) (pediatric): Secondary | ICD-10-CM | POA: Diagnosis not present

## 2017-10-12 DIAGNOSIS — F419 Anxiety disorder, unspecified: Secondary | ICD-10-CM | POA: Diagnosis not present

## 2017-10-12 DIAGNOSIS — M25511 Pain in right shoulder: Secondary | ICD-10-CM | POA: Diagnosis not present

## 2017-10-13 DIAGNOSIS — F4322 Adjustment disorder with anxiety: Secondary | ICD-10-CM | POA: Diagnosis not present

## 2017-10-17 DIAGNOSIS — M25611 Stiffness of right shoulder, not elsewhere classified: Secondary | ICD-10-CM | POA: Diagnosis not present

## 2017-10-17 DIAGNOSIS — M25511 Pain in right shoulder: Secondary | ICD-10-CM | POA: Diagnosis not present

## 2017-10-18 DIAGNOSIS — F900 Attention-deficit hyperactivity disorder, predominantly inattentive type: Secondary | ICD-10-CM | POA: Diagnosis not present

## 2017-10-19 ENCOUNTER — Other Ambulatory Visit: Payer: Self-pay | Admitting: Internal Medicine

## 2017-10-19 DIAGNOSIS — M25511 Pain in right shoulder: Secondary | ICD-10-CM | POA: Diagnosis not present

## 2017-10-19 DIAGNOSIS — E785 Hyperlipidemia, unspecified: Secondary | ICD-10-CM

## 2017-10-19 DIAGNOSIS — M25611 Stiffness of right shoulder, not elsewhere classified: Secondary | ICD-10-CM | POA: Diagnosis not present

## 2017-10-20 DIAGNOSIS — F4322 Adjustment disorder with anxiety: Secondary | ICD-10-CM | POA: Diagnosis not present

## 2017-10-24 DIAGNOSIS — M25611 Stiffness of right shoulder, not elsewhere classified: Secondary | ICD-10-CM | POA: Diagnosis not present

## 2017-10-24 DIAGNOSIS — M25511 Pain in right shoulder: Secondary | ICD-10-CM | POA: Diagnosis not present

## 2017-10-25 DIAGNOSIS — F900 Attention-deficit hyperactivity disorder, predominantly inattentive type: Secondary | ICD-10-CM | POA: Diagnosis not present

## 2017-10-26 DIAGNOSIS — M25611 Stiffness of right shoulder, not elsewhere classified: Secondary | ICD-10-CM | POA: Diagnosis not present

## 2017-10-26 DIAGNOSIS — M25511 Pain in right shoulder: Secondary | ICD-10-CM | POA: Diagnosis not present

## 2017-10-27 DIAGNOSIS — F4322 Adjustment disorder with anxiety: Secondary | ICD-10-CM | POA: Diagnosis not present

## 2017-10-31 DIAGNOSIS — M25511 Pain in right shoulder: Secondary | ICD-10-CM | POA: Diagnosis not present

## 2017-10-31 DIAGNOSIS — M25611 Stiffness of right shoulder, not elsewhere classified: Secondary | ICD-10-CM | POA: Diagnosis not present

## 2017-11-01 DIAGNOSIS — F900 Attention-deficit hyperactivity disorder, predominantly inattentive type: Secondary | ICD-10-CM | POA: Diagnosis not present

## 2017-11-02 DIAGNOSIS — M25611 Stiffness of right shoulder, not elsewhere classified: Secondary | ICD-10-CM | POA: Diagnosis not present

## 2017-11-02 DIAGNOSIS — M25511 Pain in right shoulder: Secondary | ICD-10-CM | POA: Diagnosis not present

## 2017-11-03 DIAGNOSIS — F4322 Adjustment disorder with anxiety: Secondary | ICD-10-CM | POA: Diagnosis not present

## 2017-11-07 DIAGNOSIS — F4322 Adjustment disorder with anxiety: Secondary | ICD-10-CM | POA: Diagnosis not present

## 2017-11-07 DIAGNOSIS — M25511 Pain in right shoulder: Secondary | ICD-10-CM | POA: Diagnosis not present

## 2017-11-07 DIAGNOSIS — M25611 Stiffness of right shoulder, not elsewhere classified: Secondary | ICD-10-CM | POA: Diagnosis not present

## 2017-11-08 DIAGNOSIS — F900 Attention-deficit hyperactivity disorder, predominantly inattentive type: Secondary | ICD-10-CM | POA: Diagnosis not present

## 2017-11-08 DIAGNOSIS — M9903 Segmental and somatic dysfunction of lumbar region: Secondary | ICD-10-CM | POA: Diagnosis not present

## 2017-11-08 DIAGNOSIS — M542 Cervicalgia: Secondary | ICD-10-CM | POA: Diagnosis not present

## 2017-11-08 DIAGNOSIS — M5441 Lumbago with sciatica, right side: Secondary | ICD-10-CM | POA: Diagnosis not present

## 2017-11-08 DIAGNOSIS — M5137 Other intervertebral disc degeneration, lumbosacral region: Secondary | ICD-10-CM | POA: Diagnosis not present

## 2017-11-09 DIAGNOSIS — M25611 Stiffness of right shoulder, not elsewhere classified: Secondary | ICD-10-CM | POA: Diagnosis not present

## 2017-11-09 DIAGNOSIS — M25511 Pain in right shoulder: Secondary | ICD-10-CM | POA: Diagnosis not present

## 2017-11-10 DIAGNOSIS — F4322 Adjustment disorder with anxiety: Secondary | ICD-10-CM | POA: Diagnosis not present

## 2017-11-14 DIAGNOSIS — M25511 Pain in right shoulder: Secondary | ICD-10-CM | POA: Diagnosis not present

## 2017-11-14 DIAGNOSIS — M25611 Stiffness of right shoulder, not elsewhere classified: Secondary | ICD-10-CM | POA: Diagnosis not present

## 2017-11-15 DIAGNOSIS — F900 Attention-deficit hyperactivity disorder, predominantly inattentive type: Secondary | ICD-10-CM | POA: Diagnosis not present

## 2017-11-16 ENCOUNTER — Telehealth (INDEPENDENT_AMBULATORY_CARE_PROVIDER_SITE_OTHER): Payer: Self-pay | Admitting: Orthopedic Surgery

## 2017-11-16 DIAGNOSIS — M25611 Stiffness of right shoulder, not elsewhere classified: Secondary | ICD-10-CM | POA: Diagnosis not present

## 2017-11-16 DIAGNOSIS — M25511 Pain in right shoulder: Secondary | ICD-10-CM | POA: Diagnosis not present

## 2017-11-16 NOTE — Telephone Encounter (Signed)
FYI

## 2017-11-16 NOTE — Telephone Encounter (Signed)
Patient called and has now completed PT, they advised him to come back here to have a steroid/cortisone injection to calm down the inflammation in his right shoulder. FYI I made his appt for Monday March 4th.

## 2017-11-16 NOTE — Telephone Encounter (Signed)
thx

## 2017-11-17 DIAGNOSIS — F4322 Adjustment disorder with anxiety: Secondary | ICD-10-CM | POA: Diagnosis not present

## 2017-11-21 DIAGNOSIS — M25611 Stiffness of right shoulder, not elsewhere classified: Secondary | ICD-10-CM | POA: Diagnosis not present

## 2017-11-21 DIAGNOSIS — M25511 Pain in right shoulder: Secondary | ICD-10-CM | POA: Diagnosis not present

## 2017-11-22 DIAGNOSIS — F900 Attention-deficit hyperactivity disorder, predominantly inattentive type: Secondary | ICD-10-CM | POA: Diagnosis not present

## 2017-11-23 DIAGNOSIS — M25611 Stiffness of right shoulder, not elsewhere classified: Secondary | ICD-10-CM | POA: Diagnosis not present

## 2017-11-23 DIAGNOSIS — M25511 Pain in right shoulder: Secondary | ICD-10-CM | POA: Diagnosis not present

## 2017-11-24 DIAGNOSIS — F4322 Adjustment disorder with anxiety: Secondary | ICD-10-CM | POA: Diagnosis not present

## 2017-11-28 ENCOUNTER — Ambulatory Visit (INDEPENDENT_AMBULATORY_CARE_PROVIDER_SITE_OTHER): Payer: BLUE CROSS/BLUE SHIELD | Admitting: Orthopedic Surgery

## 2017-11-28 ENCOUNTER — Encounter (INDEPENDENT_AMBULATORY_CARE_PROVIDER_SITE_OTHER): Payer: Self-pay | Admitting: Orthopedic Surgery

## 2017-11-28 DIAGNOSIS — M25511 Pain in right shoulder: Secondary | ICD-10-CM | POA: Diagnosis not present

## 2017-11-28 MED ORDER — BUPIVACAINE HCL 0.5 % IJ SOLN
9.0000 mL | INTRAMUSCULAR | Status: AC | PRN
  Administered 2017-11-28: 9 mL via INTRA_ARTICULAR

## 2017-11-28 MED ORDER — METHYLPREDNISOLONE ACETATE 40 MG/ML IJ SUSP
40.0000 mg | INTRAMUSCULAR | Status: AC | PRN
  Administered 2017-11-28: 40 mg via INTRA_ARTICULAR

## 2017-11-28 MED ORDER — LIDOCAINE HCL 1 % IJ SOLN
5.0000 mL | INTRAMUSCULAR | Status: AC | PRN
  Administered 2017-11-28: 5 mL

## 2017-11-28 NOTE — Progress Notes (Signed)
Office Visit Note   Patient: Cory Joyce           Date of Birth: Feb 25, 1956           MRN: [id number] Visit Date: 11/28/2017 Requested by: Janith Lima, MD [address], [place] [address] PCP: Janith Lima, MD  Subjective: Chief Complaint  Patient presents with  . Right Shoulder - Routine Post Op    HPI: Patient presents for follow-up of right shoulder.  He has had an MRI scan which shows some tendinitis and a posterior labral tear which is small.  Is been having pain primarily anterior aspect of the shoulder is long with the deltoid.  He is not really localizing much in terms of symptoms to the [facility] joint.  No real neck pain or radicular symptoms.  All this started when he was doing repetitive pulling on a lawnmower cord.              ROS: All systems reviewed are negative as they relate to the chief complaint within the history of present illness.  Patient denies  fevers or chills.   Assessment & Plan: Visit Diagnoses:  1. Right shoulder pain, unspecified chronicity     Plan: Impression is tendinitis which has been refractory to nonoperative management including anti-inflammatories and therapy.  Plan is to try a subacromial injection today.  Follow-up as needed.  I do not think there is a surgical indication in the shoulder at this time.  Follow-Up Instructions: Return if symptoms worsen or fail to improve.   Orders:  No orders of the defined types were placed in this encounter.  No orders of the defined types were placed in this encounter.     Procedures: Large Joint Inj: R glenohumeral on 11/28/2017 2:29 PM Indications: diagnostic evaluation and pain Details: 18 G 1.5 in needle, posterior approach  Arthrogram: No  Medications: 9 mL bupivacaine 0.5 %; 40 mg methylPREDNISolone acetate 40 MG/ML; 5 mL lidocaine 1 % Outcome: tolerated well, no immediate complications Procedure, treatment alternatives, risks and benefits explained,  specific risks discussed. Consent was given by the patient. Immediately prior to procedure a time out was called to verify the correct patient, procedure, equipment, support staff and site/side marked as required. Patient was prepped and draped in the usual sterile fashion.       Clinical Data: No additional findings.  Objective: Vital Signs: There were no vitals taken for this visit.  Physical Exam:   Constitutional: Patient appears well-developed HEENT:  Head: Normocephalic Eyes:EOM are normal Neck: Normal range of motion Cardiovascular: Normal rate Pulmonary/chest: Effort normal Neurologic: Patient is alert Skin: Skin is warm Psychiatric: Patient has normal mood and affect    Ortho Exam: Orthopedic exam demonstrates good cervical spine range of motion.  Good active and passive range of motion of the right shoulder with good rotator cuff strength.  No discrete AC joint tenderness is present.  No other masses lymph adenopathy or skin changes noted in the shoulder girdle region.  Impingement signs are positive.  Specialty Comments:  No specialty comments available.  Imaging: No results found.   PMFS History: Patient Active Problem List   Diagnosis Date Noted  . OSA (obstructive sleep apnea) 12/29/2016  . Attention deficit hyperactivity disorder (ADHD) 12/29/2016  . Exposure to the flu 12/21/2016  . Chronic right shoulder pain 11/22/2016  . Hypersomnia with sleep apnea 06/02/2016  . Hyperglycemia 05/11/2016  . Snoring 05/11/2016  . BCC (basal cell  carcinoma of skin) 12/01/2015  . Keratosis of plantar aspect of foot 12/01/2015  . Hyperlipidemia with target LDL less than 130 12/01/2015  . Routine general medical examination at a health care facility 07/01/2014  . Personal history of colonic polyps 09/22/2010   Past Medical History:  Diagnosis Date  . ADHD (attention deficit hyperactivity disorder)   . BCC (basal cell carcinoma), arm    left forearm 10 years ago    . Hyperlipidemia    borderline but no treatment  . Personal history of colonic polyps 09/22/2010  . Right clavicle fracture Jan 2011  . Sleep apnea    tried but unsuccesssful for c pap    Family History  Problem Relation Age of Onset  . Prostate cancer Father   . Alcohol abuse Father   . Colon polyps Father   . Cancer Neg Hx   . Diabetes Neg Hx   . Drug abuse Neg Hx   . Early death Neg Hx   . Heart disease Neg Hx   . Hyperlipidemia Neg Hx   . Hypertension Neg Hx   . Kidney disease Neg Hx   . Stroke Neg Hx   . Colon cancer Neg Hx   . Esophageal cancer Neg Hx   . Rectal cancer Neg Hx   . Stomach cancer Neg Hx     Past Surgical History:  Procedure Laterality Date  . COLONOSCOPY  09-22-2010   Social History   Occupational History  . 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 Smoking status: Never Smoker  . Smokeless tobacco: Never Used  Substance and Sexual Activity  . Alcohol use: Yes    Alcohol/week: 8.4 oz    Types: 14 Cans of beer per week    Comment: several a week   . Drug use: No  . Sexual activity: Yes

## 2017-11-29 DIAGNOSIS — M25511 Pain in right shoulder: Secondary | ICD-10-CM | POA: Diagnosis not present

## 2017-11-29 DIAGNOSIS — F900 Attention-deficit hyperactivity disorder, predominantly inattentive type: Secondary | ICD-10-CM | POA: Diagnosis not present

## 2017-11-29 DIAGNOSIS — M25611 Stiffness of right shoulder, not elsewhere classified: Secondary | ICD-10-CM | POA: Diagnosis not present

## 2017-12-01 DIAGNOSIS — M25611 Stiffness of right shoulder, not elsewhere classified: Secondary | ICD-10-CM | POA: Diagnosis not present

## 2017-12-01 DIAGNOSIS — M25511 Pain in right shoulder: Secondary | ICD-10-CM | POA: Diagnosis not present

## 2017-12-01 DIAGNOSIS — F4322 Adjustment disorder with anxiety: Secondary | ICD-10-CM | POA: Diagnosis not present

## 2017-12-05 DIAGNOSIS — M25511 Pain in right shoulder: Secondary | ICD-10-CM | POA: Diagnosis not present

## 2017-12-05 DIAGNOSIS — M25611 Stiffness of right shoulder, not elsewhere classified: Secondary | ICD-10-CM | POA: Diagnosis not present

## 2017-12-06 DIAGNOSIS — F900 Attention-deficit hyperactivity disorder, predominantly inattentive type: Secondary | ICD-10-CM | POA: Diagnosis not present

## 2017-12-12 DIAGNOSIS — M25611 Stiffness of right shoulder, not elsewhere classified: Secondary | ICD-10-CM | POA: Diagnosis not present

## 2017-12-12 DIAGNOSIS — M25511 Pain in right shoulder: Secondary | ICD-10-CM | POA: Diagnosis not present

## 2017-12-16 DIAGNOSIS — M25511 Pain in right shoulder: Secondary | ICD-10-CM | POA: Diagnosis not present

## 2017-12-16 DIAGNOSIS — M25611 Stiffness of right shoulder, not elsewhere classified: Secondary | ICD-10-CM | POA: Diagnosis not present

## 2017-12-20 DIAGNOSIS — F900 Attention-deficit hyperactivity disorder, predominantly inattentive type: Secondary | ICD-10-CM | POA: Diagnosis not present

## 2017-12-22 DIAGNOSIS — F4322 Adjustment disorder with anxiety: Secondary | ICD-10-CM | POA: Diagnosis not present

## 2017-12-23 DIAGNOSIS — M25611 Stiffness of right shoulder, not elsewhere classified: Secondary | ICD-10-CM | POA: Diagnosis not present

## 2017-12-23 DIAGNOSIS — M25511 Pain in right shoulder: Secondary | ICD-10-CM | POA: Diagnosis not present

## 2017-12-26 DIAGNOSIS — M542 Cervicalgia: Secondary | ICD-10-CM | POA: Diagnosis not present

## 2017-12-26 DIAGNOSIS — M9903 Segmental and somatic dysfunction of lumbar region: Secondary | ICD-10-CM | POA: Diagnosis not present

## 2017-12-26 DIAGNOSIS — M5441 Lumbago with sciatica, right side: Secondary | ICD-10-CM | POA: Diagnosis not present

## 2017-12-26 DIAGNOSIS — M5137 Other intervertebral disc degeneration, lumbosacral region: Secondary | ICD-10-CM | POA: Diagnosis not present

## 2017-12-27 DIAGNOSIS — F900 Attention-deficit hyperactivity disorder, predominantly inattentive type: Secondary | ICD-10-CM | POA: Diagnosis not present

## 2017-12-28 DIAGNOSIS — M25511 Pain in right shoulder: Secondary | ICD-10-CM | POA: Diagnosis not present

## 2017-12-28 DIAGNOSIS — M25611 Stiffness of right shoulder, not elsewhere classified: Secondary | ICD-10-CM | POA: Diagnosis not present

## 2017-12-30 DIAGNOSIS — M25611 Stiffness of right shoulder, not elsewhere classified: Secondary | ICD-10-CM | POA: Diagnosis not present

## 2017-12-30 DIAGNOSIS — M25511 Pain in right shoulder: Secondary | ICD-10-CM | POA: Diagnosis not present

## 2018-01-03 DIAGNOSIS — M25511 Pain in right shoulder: Secondary | ICD-10-CM | POA: Diagnosis not present

## 2018-01-03 DIAGNOSIS — F900 Attention-deficit hyperactivity disorder, predominantly inattentive type: Secondary | ICD-10-CM | POA: Diagnosis not present

## 2018-01-03 DIAGNOSIS — M25611 Stiffness of right shoulder, not elsewhere classified: Secondary | ICD-10-CM | POA: Diagnosis not present

## 2018-01-04 ENCOUNTER — Ambulatory Visit (INDEPENDENT_AMBULATORY_CARE_PROVIDER_SITE_OTHER): Payer: BLUE CROSS/BLUE SHIELD | Admitting: Adult Health

## 2018-01-04 ENCOUNTER — Encounter: Payer: Self-pay | Admitting: Adult Health

## 2018-01-04 DIAGNOSIS — G4733 Obstructive sleep apnea (adult) (pediatric): Secondary | ICD-10-CM

## 2018-01-04 NOTE — Progress Notes (Signed)
@Patient  ID: Cory Joyce, male    DOB: 19-Aug-1956, 62 y.o.   MRN: [id number]  Chief Complaint  Patient presents with  . Follow-up    OSA     Referring provider: Janith Lima, MD  HPI: 62 yo male seen for sleep consult 05/2016 found to have Moderate OSA on HST started on CPAP   TEST  HST 06/30/16 >AHI 15/hr   01/04/2018 Follow up ; OSA  Returns for a one-year follow-up.  Patient says he is doing well on CPAP feels that he benefits with decreased daytime sleepiness he wears his CPAP each night.  Never misses any nights. Download shows excellent compliance with average usage around 7 hours.  Patient is on CPAP 9 cm H2O.  AHI 3.6.  Minimum leaks. Patient has the CPAP APP for his phone and monitors his daily control.   Allergies  Allergen Reactions  . Thimerosal Itching    Caused eyes to be red and itching     Immunization History  Administered Date(s) Administered  . Influenza Split 06/11/2017  . Influenza Whole 07/28/2010  . Influenza,inj,Quad PF,6+ Mos 06/24/2016  . Influenza-Unspecified 07/12/2015  . Td 09/27/2004  . Tdap 07/01/2014    Past Medical History:  Diagnosis Date  . ADHD (attention deficit hyperactivity disorder)   . BCC (basal cell carcinoma), arm    left forearm 10 years ago  . Hyperlipidemia    borderline but no treatment  . Personal history of colonic polyps 09/22/2010  . Right clavicle fracture Jan 2011  . Sleep apnea    tried but unsuccesssful for c pap    Tobacco History: Social History   Tobacco Use  Smoking Status Never Smoker  Smokeless Tobacco Never Used   Counseling given: Not Answered   Outpatient Encounter Medications as of 01/04/2018  Medication Sig  . aspirin 81 MG tablet Take 81 mg by mouth daily.  Marland Kitchen atorvastatin (LIPITOR) 40 MG tablet TAKE 1 TABLET BY MOUTH EVERY DAY  . Methylphenidate 25.9 MG TBED Take 2 tablets by mouth daily with breakfast.   . [DISCONTINUED] atomoxetine (STRATTERA) 100 MG capsule Take 100  mg by mouth daily.  . [DISCONTINUED] etodolac (LODINE XL) 400 MG 24 hr tablet Take 1 tablet (400 mg total) by mouth daily. (Patient not taking: Reported on 01/04/2018)  . [DISCONTINUED] methylphenidate (METADATE ER) 20 MG ER tablet Take 20 mg by mouth 3 (three) times daily.   No facility-administered encounter medications on file as of 01/04/2018.      Review of Systems  Constitutional:   No  weight loss, night sweats,  Fevers, chills, fatigue, or  lassitude.  HEENT:   No headaches,  Difficulty swallowing,  Tooth/dental problems, or  Sore throat,                No sneezing, itching, ear ache, nasal congestion, post nasal drip,   CV:  No chest pain,  Orthopnea, PND, swelling in lower extremities, anasarca, dizziness, palpitations, syncope.   GI  No heartburn, indigestion, abdominal pain, nausea, vomiting, diarrhea, change in bowel habits, loss of appetite, bloody stools.   Resp: No shortness of breath with exertion or at rest.  No excess mucus, no productive cough,  No non-productive cough,  No coughing up of blood.  No change in color of mucus.  No wheezing.  No chest wall deformity  Skin: no rash or lesions.  GU: no dysuria, change in color of urine, no urgency or frequency.  No flank pain, no hematuria  MS:  No joint pain or swelling.  No decreased range of motion.  No back pain.    Physical Exam  BP 126/80 (BP Location: Right Arm, Cuff Size: Normal)   Pulse 75   Ht 5\' 10"  (1.778 m)   Wt 199 lb 6.4 oz (90.4 kg)   SpO2 99%   BMI 28.61 kg/m   GEN: A/Ox3; pleasant , NAD, well nourished    HEENT:  [place]/AT,  EACs-clear, TMs-wnl, NOSE-clear, THROAT-clear, no lesions, no postnasal drip or exudate noted. Class 2 MP airway   NECK:  Supple w/ fair ROM; no JVD; normal carotid impulses w/o bruits; no thyromegaly or nodules palpated; no lymphadenopathy.    RESP  Clear  P & A; w/o, wheezes/ rales/ or rhonchi. no accessory muscle use, no dullness to percussion  CARD:  RRR, no m/r/g, no  peripheral edema, pulses intact, no cyanosis or clubbing.  GI:   Soft & nt; nml bowel sounds; no organomegaly or masses detected.   Musco: Warm bil, no deformities or joint swelling noted.   Neuro: alert, no focal deficits noted.    Skin: Warm, no lesions or rashes    Lab Results:  CBC  BNP No results found for: BNP  ProBNP No results found for: PROBNP  Imaging: No results found.   Assessment & Plan:   OSA (obstructive sleep apnea) Excellent control and compliance  Plan  Patient Instructions  Continue on CPAP At bedtime   Keep up good work .  Do not drive if sleepy.  Follow up Dr. Elsworth Soho in  1 year . and As needed            Rexene Edison, NP 01/04/2018

## 2018-01-04 NOTE — Addendum Note (Signed)
Addended by: Parke Poisson E on: 01/04/2018 10:39 AM   Modules accepted: Orders

## 2018-01-04 NOTE — Assessment & Plan Note (Signed)
Excellent control and compliance  Plan  Patient Instructions  Continue on CPAP At bedtime   Keep up good work .  Do not drive if sleepy.  Follow up Dr. Elsworth Soho in  1 year . and As needed

## 2018-01-04 NOTE — Patient Instructions (Signed)
Continue on CPAP At bedtime   Keep up good work .  Do not drive if sleepy.  Follow up Dr. Elsworth Soho in  1 year . and As needed

## 2018-01-05 DIAGNOSIS — F4322 Adjustment disorder with anxiety: Secondary | ICD-10-CM | POA: Diagnosis not present

## 2018-01-06 DIAGNOSIS — M25611 Stiffness of right shoulder, not elsewhere classified: Secondary | ICD-10-CM | POA: Diagnosis not present

## 2018-01-06 DIAGNOSIS — M25511 Pain in right shoulder: Secondary | ICD-10-CM | POA: Diagnosis not present

## 2018-01-10 DIAGNOSIS — M25611 Stiffness of right shoulder, not elsewhere classified: Secondary | ICD-10-CM | POA: Diagnosis not present

## 2018-01-10 DIAGNOSIS — M25511 Pain in right shoulder: Secondary | ICD-10-CM | POA: Diagnosis not present

## 2018-01-10 DIAGNOSIS — F900 Attention-deficit hyperactivity disorder, predominantly inattentive type: Secondary | ICD-10-CM | POA: Diagnosis not present

## 2018-01-12 DIAGNOSIS — Z79899 Other long term (current) drug therapy: Secondary | ICD-10-CM | POA: Diagnosis not present

## 2018-01-12 DIAGNOSIS — G4733 Obstructive sleep apnea (adult) (pediatric): Secondary | ICD-10-CM | POA: Diagnosis not present

## 2018-01-12 DIAGNOSIS — F4322 Adjustment disorder with anxiety: Secondary | ICD-10-CM | POA: Diagnosis not present

## 2018-01-12 DIAGNOSIS — F902 Attention-deficit hyperactivity disorder, combined type: Secondary | ICD-10-CM | POA: Diagnosis not present

## 2018-01-12 DIAGNOSIS — F419 Anxiety disorder, unspecified: Secondary | ICD-10-CM | POA: Diagnosis not present

## 2018-01-13 DIAGNOSIS — M25511 Pain in right shoulder: Secondary | ICD-10-CM | POA: Diagnosis not present

## 2018-01-13 DIAGNOSIS — M25611 Stiffness of right shoulder, not elsewhere classified: Secondary | ICD-10-CM | POA: Diagnosis not present

## 2018-01-17 DIAGNOSIS — M25611 Stiffness of right shoulder, not elsewhere classified: Secondary | ICD-10-CM | POA: Diagnosis not present

## 2018-01-17 DIAGNOSIS — M25511 Pain in right shoulder: Secondary | ICD-10-CM | POA: Diagnosis not present

## 2018-01-17 DIAGNOSIS — F900 Attention-deficit hyperactivity disorder, predominantly inattentive type: Secondary | ICD-10-CM | POA: Diagnosis not present

## 2018-01-19 DIAGNOSIS — M25611 Stiffness of right shoulder, not elsewhere classified: Secondary | ICD-10-CM | POA: Diagnosis not present

## 2018-01-19 DIAGNOSIS — M25511 Pain in right shoulder: Secondary | ICD-10-CM | POA: Diagnosis not present

## 2018-01-19 DIAGNOSIS — F4322 Adjustment disorder with anxiety: Secondary | ICD-10-CM | POA: Diagnosis not present

## 2018-01-23 DIAGNOSIS — M5441 Lumbago with sciatica, right side: Secondary | ICD-10-CM | POA: Diagnosis not present

## 2018-01-23 DIAGNOSIS — M9903 Segmental and somatic dysfunction of lumbar region: Secondary | ICD-10-CM | POA: Diagnosis not present

## 2018-01-23 DIAGNOSIS — M5137 Other intervertebral disc degeneration, lumbosacral region: Secondary | ICD-10-CM | POA: Diagnosis not present

## 2018-01-23 DIAGNOSIS — M542 Cervicalgia: Secondary | ICD-10-CM | POA: Diagnosis not present

## 2018-01-27 DIAGNOSIS — F4322 Adjustment disorder with anxiety: Secondary | ICD-10-CM | POA: Diagnosis not present

## 2018-01-30 DIAGNOSIS — M5441 Lumbago with sciatica, right side: Secondary | ICD-10-CM | POA: Diagnosis not present

## 2018-01-30 DIAGNOSIS — M542 Cervicalgia: Secondary | ICD-10-CM | POA: Diagnosis not present

## 2018-01-30 DIAGNOSIS — M9903 Segmental and somatic dysfunction of lumbar region: Secondary | ICD-10-CM | POA: Diagnosis not present

## 2018-01-30 DIAGNOSIS — M5137 Other intervertebral disc degeneration, lumbosacral region: Secondary | ICD-10-CM | POA: Diagnosis not present

## 2018-01-31 DIAGNOSIS — F900 Attention-deficit hyperactivity disorder, predominantly inattentive type: Secondary | ICD-10-CM | POA: Diagnosis not present

## 2018-01-31 DIAGNOSIS — M25511 Pain in right shoulder: Secondary | ICD-10-CM | POA: Diagnosis not present

## 2018-01-31 DIAGNOSIS — M25611 Stiffness of right shoulder, not elsewhere classified: Secondary | ICD-10-CM | POA: Diagnosis not present

## 2018-02-03 DIAGNOSIS — M25511 Pain in right shoulder: Secondary | ICD-10-CM | POA: Diagnosis not present

## 2018-02-03 DIAGNOSIS — M25611 Stiffness of right shoulder, not elsewhere classified: Secondary | ICD-10-CM | POA: Diagnosis not present

## 2018-02-07 DIAGNOSIS — M25511 Pain in right shoulder: Secondary | ICD-10-CM | POA: Diagnosis not present

## 2018-02-07 DIAGNOSIS — F900 Attention-deficit hyperactivity disorder, predominantly inattentive type: Secondary | ICD-10-CM | POA: Diagnosis not present

## 2018-02-07 DIAGNOSIS — M25611 Stiffness of right shoulder, not elsewhere classified: Secondary | ICD-10-CM | POA: Diagnosis not present

## 2018-02-09 DIAGNOSIS — F4322 Adjustment disorder with anxiety: Secondary | ICD-10-CM | POA: Diagnosis not present

## 2018-02-10 DIAGNOSIS — M25511 Pain in right shoulder: Secondary | ICD-10-CM | POA: Diagnosis not present

## 2018-02-10 DIAGNOSIS — M25611 Stiffness of right shoulder, not elsewhere classified: Secondary | ICD-10-CM | POA: Diagnosis not present

## 2018-02-13 DIAGNOSIS — M5137 Other intervertebral disc degeneration, lumbosacral region: Secondary | ICD-10-CM | POA: Diagnosis not present

## 2018-02-13 DIAGNOSIS — M9903 Segmental and somatic dysfunction of lumbar region: Secondary | ICD-10-CM | POA: Diagnosis not present

## 2018-02-13 DIAGNOSIS — M542 Cervicalgia: Secondary | ICD-10-CM | POA: Diagnosis not present

## 2018-02-13 DIAGNOSIS — M5441 Lumbago with sciatica, right side: Secondary | ICD-10-CM | POA: Diagnosis not present

## 2018-02-14 ENCOUNTER — Other Ambulatory Visit: Payer: Self-pay | Admitting: Internal Medicine

## 2018-02-14 DIAGNOSIS — E785 Hyperlipidemia, unspecified: Secondary | ICD-10-CM

## 2018-02-18 ENCOUNTER — Other Ambulatory Visit: Payer: Self-pay | Admitting: Internal Medicine

## 2018-02-18 DIAGNOSIS — E785 Hyperlipidemia, unspecified: Secondary | ICD-10-CM

## 2018-02-21 DIAGNOSIS — F900 Attention-deficit hyperactivity disorder, predominantly inattentive type: Secondary | ICD-10-CM | POA: Diagnosis not present

## 2018-02-23 DIAGNOSIS — F4322 Adjustment disorder with anxiety: Secondary | ICD-10-CM | POA: Diagnosis not present

## 2018-02-28 IMAGING — DX DG SHOULDER 2+V*R*
3 series · 3 of 3 positions shown · non-contrast
Comparison: 10/20/2009

CLINICAL DATA: Right shoulder pain for 3 weeks, right arm pain

EXAM:
RIGHT SHOULDER - 2+ VIEW

[grashey]
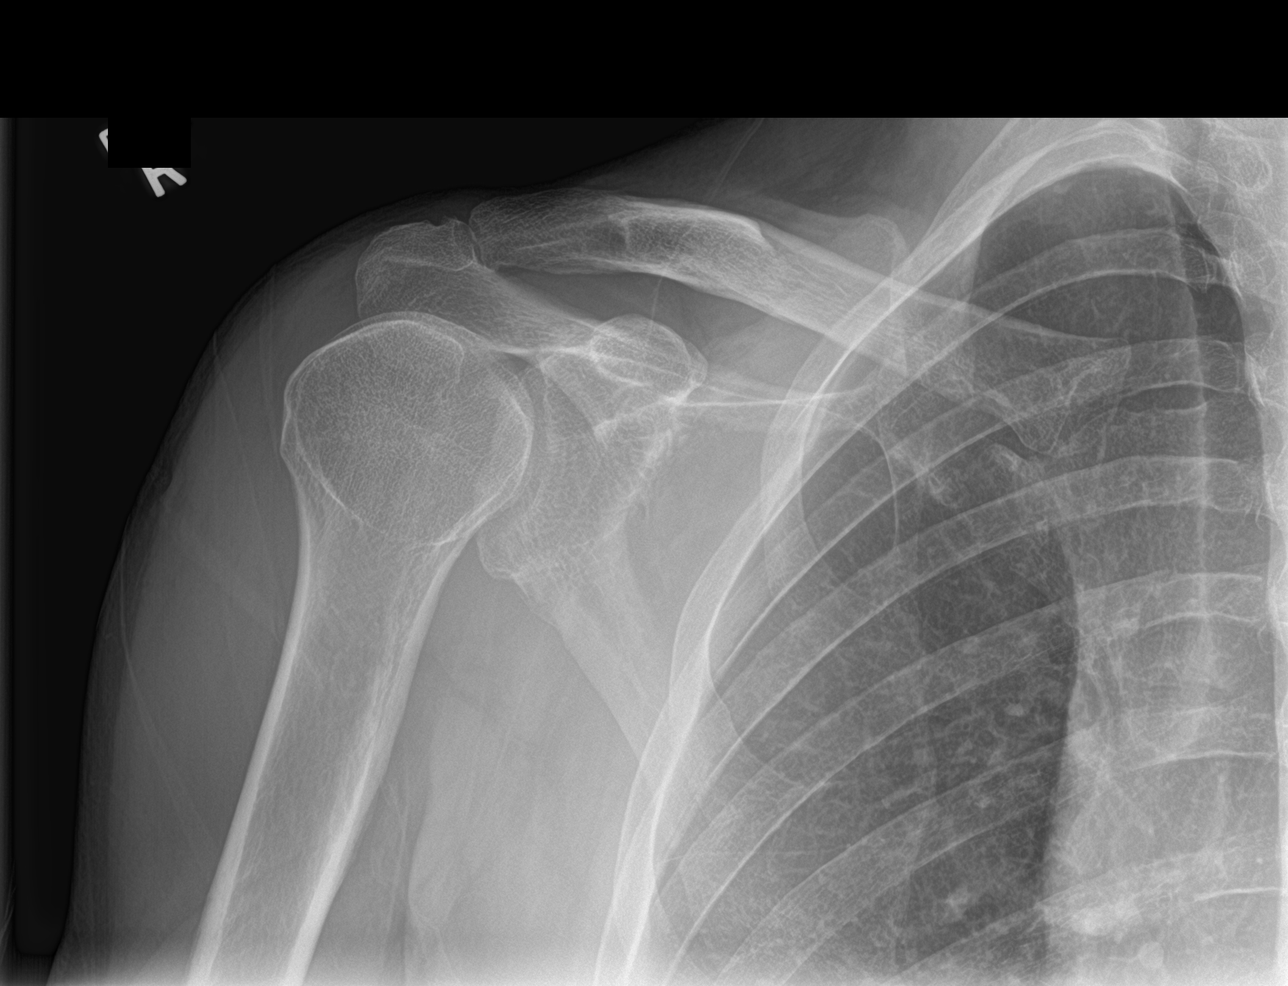

[y view]
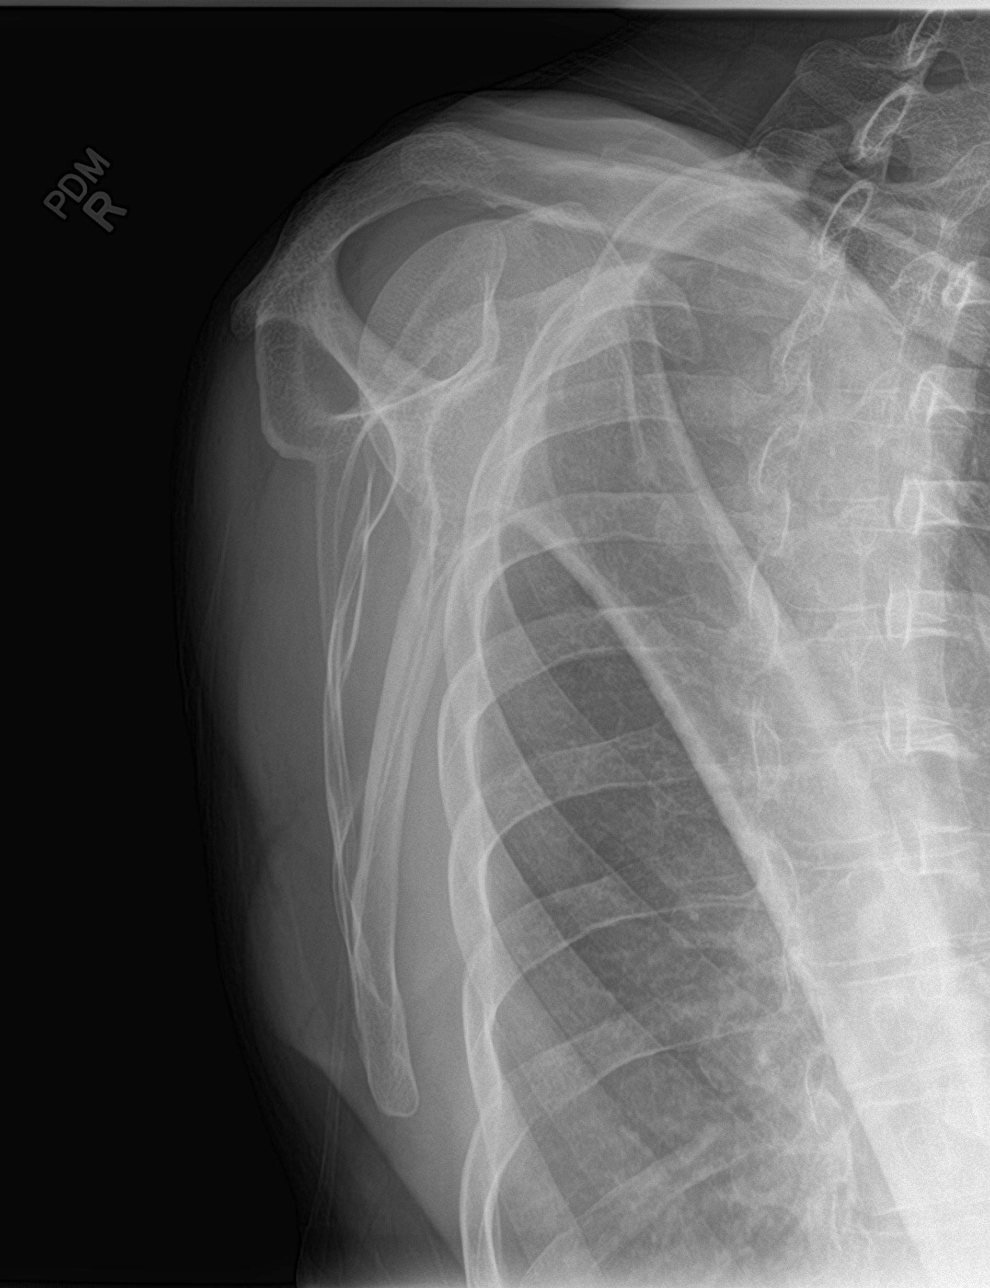

[shoulder axial]
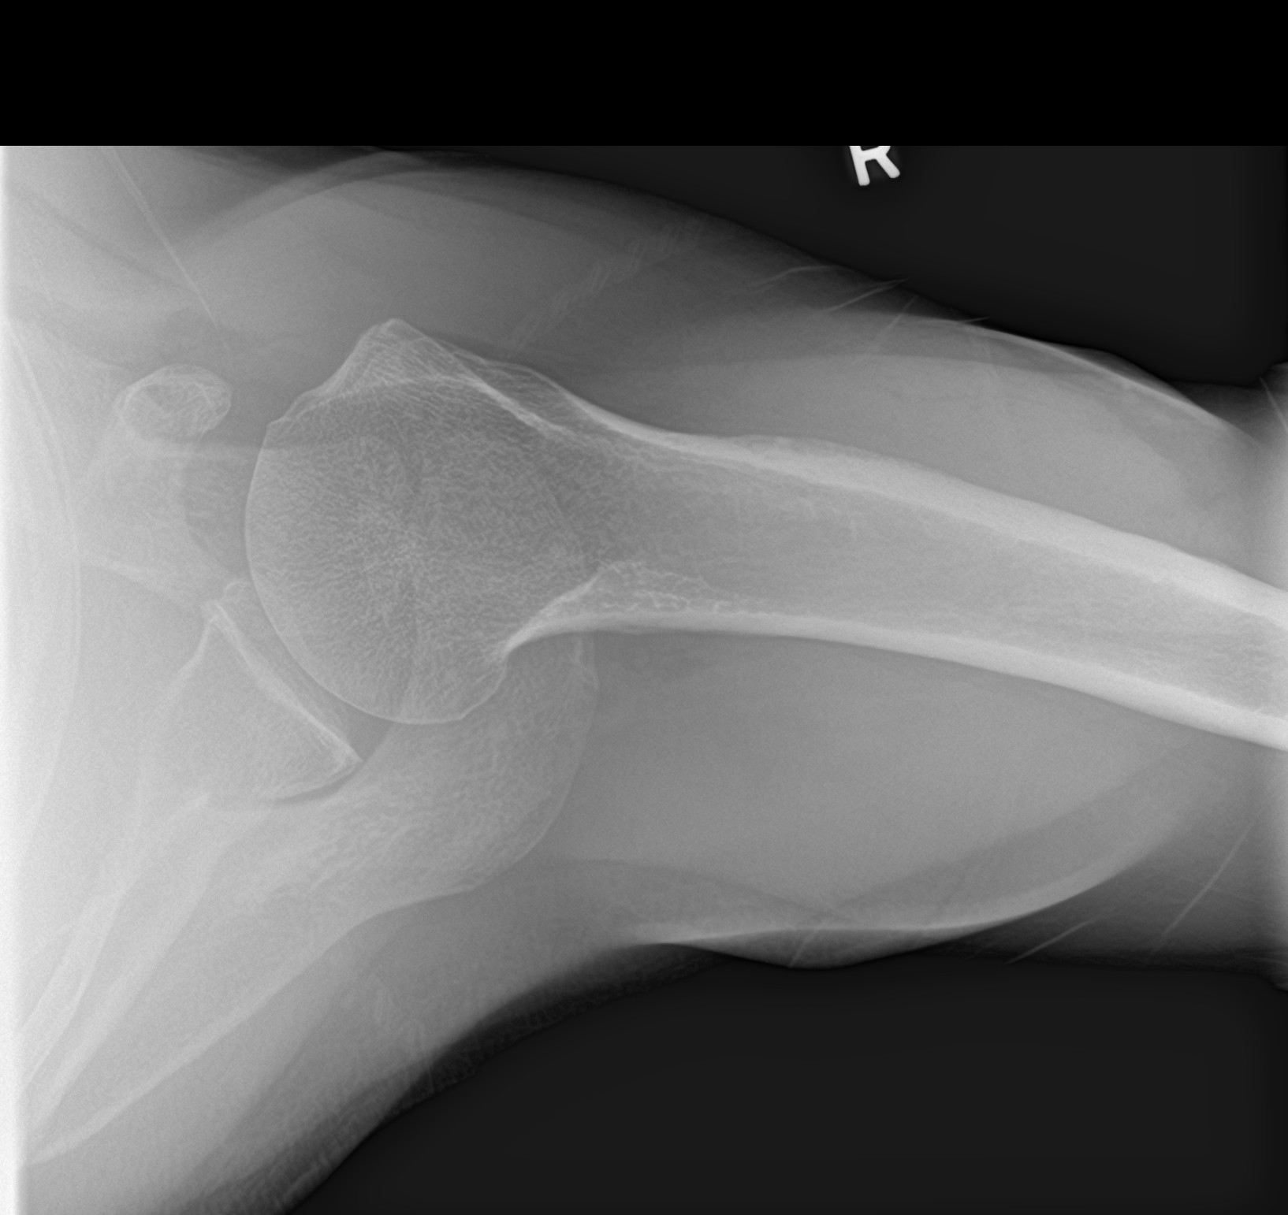

[3 of 3 positions shown; findings below may reference images not displayed]

FINDINGS: Three views of the right shoulder submitted. No acute fracture or
subluxation. There is healed old distal right clavicle fracture with
anatomic alignment. Moderate degenerative changes AC joint. Mild
degenerative narrowing of glenohumeral joint space.
IMPRESSION: No acute fracture or subluxation. Moderate degenerative changes
acromioclavicular joint. Mild degenerative changes stress mild
degenerative narrowing of joint space glenohumeral joint. Healed old
fracture of distal clavicle with anatomic alignment.

## 2018-03-02 DIAGNOSIS — F4322 Adjustment disorder with anxiety: Secondary | ICD-10-CM | POA: Diagnosis not present

## 2018-03-06 DIAGNOSIS — M9903 Segmental and somatic dysfunction of lumbar region: Secondary | ICD-10-CM | POA: Diagnosis not present

## 2018-03-06 DIAGNOSIS — M5441 Lumbago with sciatica, right side: Secondary | ICD-10-CM | POA: Diagnosis not present

## 2018-03-06 DIAGNOSIS — M5137 Other intervertebral disc degeneration, lumbosacral region: Secondary | ICD-10-CM | POA: Diagnosis not present

## 2018-03-06 DIAGNOSIS — M542 Cervicalgia: Secondary | ICD-10-CM | POA: Diagnosis not present

## 2018-03-07 DIAGNOSIS — F900 Attention-deficit hyperactivity disorder, predominantly inattentive type: Secondary | ICD-10-CM | POA: Diagnosis not present

## 2018-03-09 DIAGNOSIS — F4322 Adjustment disorder with anxiety: Secondary | ICD-10-CM | POA: Diagnosis not present

## 2018-03-13 DIAGNOSIS — M5441 Lumbago with sciatica, right side: Secondary | ICD-10-CM | POA: Diagnosis not present

## 2018-03-13 DIAGNOSIS — M542 Cervicalgia: Secondary | ICD-10-CM | POA: Diagnosis not present

## 2018-03-13 DIAGNOSIS — M5137 Other intervertebral disc degeneration, lumbosacral region: Secondary | ICD-10-CM | POA: Diagnosis not present

## 2018-03-13 DIAGNOSIS — M9903 Segmental and somatic dysfunction of lumbar region: Secondary | ICD-10-CM | POA: Diagnosis not present

## 2018-03-14 DIAGNOSIS — F900 Attention-deficit hyperactivity disorder, predominantly inattentive type: Secondary | ICD-10-CM | POA: Diagnosis not present

## 2018-03-16 DIAGNOSIS — F4322 Adjustment disorder with anxiety: Secondary | ICD-10-CM | POA: Diagnosis not present

## 2018-03-21 DIAGNOSIS — F900 Attention-deficit hyperactivity disorder, predominantly inattentive type: Secondary | ICD-10-CM | POA: Diagnosis not present

## 2018-03-23 DIAGNOSIS — F4322 Adjustment disorder with anxiety: Secondary | ICD-10-CM | POA: Diagnosis not present

## 2018-03-31 ENCOUNTER — Other Ambulatory Visit: Payer: Self-pay | Admitting: Internal Medicine

## 2018-03-31 DIAGNOSIS — E785 Hyperlipidemia, unspecified: Secondary | ICD-10-CM

## 2018-04-03 DIAGNOSIS — M542 Cervicalgia: Secondary | ICD-10-CM | POA: Diagnosis not present

## 2018-04-03 DIAGNOSIS — M5137 Other intervertebral disc degeneration, lumbosacral region: Secondary | ICD-10-CM | POA: Diagnosis not present

## 2018-04-03 DIAGNOSIS — M9903 Segmental and somatic dysfunction of lumbar region: Secondary | ICD-10-CM | POA: Diagnosis not present

## 2018-04-03 DIAGNOSIS — M5441 Lumbago with sciatica, right side: Secondary | ICD-10-CM | POA: Diagnosis not present

## 2018-04-11 DIAGNOSIS — F900 Attention-deficit hyperactivity disorder, predominantly inattentive type: Secondary | ICD-10-CM | POA: Diagnosis not present

## 2018-04-13 DIAGNOSIS — F4322 Adjustment disorder with anxiety: Secondary | ICD-10-CM | POA: Diagnosis not present

## 2018-04-18 DIAGNOSIS — F900 Attention-deficit hyperactivity disorder, predominantly inattentive type: Secondary | ICD-10-CM | POA: Diagnosis not present

## 2018-04-20 DIAGNOSIS — G4733 Obstructive sleep apnea (adult) (pediatric): Secondary | ICD-10-CM | POA: Diagnosis not present

## 2018-04-20 DIAGNOSIS — F902 Attention-deficit hyperactivity disorder, combined type: Secondary | ICD-10-CM | POA: Diagnosis not present

## 2018-04-20 DIAGNOSIS — F419 Anxiety disorder, unspecified: Secondary | ICD-10-CM | POA: Diagnosis not present

## 2018-04-20 DIAGNOSIS — Z79899 Other long term (current) drug therapy: Secondary | ICD-10-CM | POA: Diagnosis not present

## 2018-04-24 DIAGNOSIS — M9903 Segmental and somatic dysfunction of lumbar region: Secondary | ICD-10-CM | POA: Diagnosis not present

## 2018-04-24 DIAGNOSIS — M5137 Other intervertebral disc degeneration, lumbosacral region: Secondary | ICD-10-CM | POA: Diagnosis not present

## 2018-04-24 DIAGNOSIS — M542 Cervicalgia: Secondary | ICD-10-CM | POA: Diagnosis not present

## 2018-04-24 DIAGNOSIS — M5441 Lumbago with sciatica, right side: Secondary | ICD-10-CM | POA: Diagnosis not present

## 2018-05-01 ENCOUNTER — Other Ambulatory Visit: Payer: Self-pay | Admitting: Internal Medicine

## 2018-05-01 DIAGNOSIS — E785 Hyperlipidemia, unspecified: Secondary | ICD-10-CM

## 2018-05-04 ENCOUNTER — Other Ambulatory Visit: Payer: Self-pay | Admitting: Internal Medicine

## 2018-05-04 DIAGNOSIS — E785 Hyperlipidemia, unspecified: Secondary | ICD-10-CM

## 2018-05-04 MED ORDER — ATORVASTATIN CALCIUM 40 MG PO TABS: 40.0000 mg | ORAL_TABLET | Freq: Every day | ORAL | 0 refills | Status: DC

## 2018-05-04 NOTE — Telephone Encounter (Signed)
Copied from [place] [id number]. Topic: Quick Communication - Rx Refill/Question >> May 04, 2018 10:55 AM Scherrie Gerlach wrote: Medication: atorvastatin (LIPITOR) 40 MG tablet  Has the patient contacted their pharmacy? yes Pt needed an appt and did not know. So now has only a few tabs left.  Pt has scheduled for 8/26 (which was first available) Pt needs a 30 day to get him through to this appt. [id number] [address], [place] (Phone) [phone] (Fax)

## 2018-05-15 DIAGNOSIS — M5441 Lumbago with sciatica, right side: Secondary | ICD-10-CM | POA: Diagnosis not present

## 2018-05-15 DIAGNOSIS — M9903 Segmental and somatic dysfunction of lumbar region: Secondary | ICD-10-CM | POA: Diagnosis not present

## 2018-05-15 DIAGNOSIS — M542 Cervicalgia: Secondary | ICD-10-CM | POA: Diagnosis not present

## 2018-05-15 DIAGNOSIS — M5137 Other intervertebral disc degeneration, lumbosacral region: Secondary | ICD-10-CM | POA: Diagnosis not present

## 2018-05-16 DIAGNOSIS — F900 Attention-deficit hyperactivity disorder, predominantly inattentive type: Secondary | ICD-10-CM | POA: Diagnosis not present

## 2018-05-22 ENCOUNTER — Encounter: Payer: Self-pay | Admitting: Internal Medicine

## 2018-05-22 ENCOUNTER — Other Ambulatory Visit (INDEPENDENT_AMBULATORY_CARE_PROVIDER_SITE_OTHER): Payer: BLUE CROSS/BLUE SHIELD

## 2018-05-22 ENCOUNTER — Ambulatory Visit (INDEPENDENT_AMBULATORY_CARE_PROVIDER_SITE_OTHER): Payer: BLUE CROSS/BLUE SHIELD | Admitting: Internal Medicine

## 2018-05-22 VITALS — BP 124/80 | HR 74 | Temp 97.8°F | Resp 16 | Ht 70.0 in | Wt 186.5 lb

## 2018-05-22 DIAGNOSIS — R0609 Other forms of dyspnea: Secondary | ICD-10-CM | POA: Diagnosis not present

## 2018-05-22 DIAGNOSIS — C4491 Basal cell carcinoma of skin, unspecified: Secondary | ICD-10-CM | POA: Diagnosis not present

## 2018-05-22 DIAGNOSIS — Z0000 Encounter for general adult medical examination without abnormal findings: Secondary | ICD-10-CM | POA: Diagnosis not present

## 2018-05-22 DIAGNOSIS — R739 Hyperglycemia, unspecified: Secondary | ICD-10-CM

## 2018-05-22 DIAGNOSIS — E785 Hyperlipidemia, unspecified: Secondary | ICD-10-CM | POA: Diagnosis not present

## 2018-05-22 DIAGNOSIS — G4733 Obstructive sleep apnea (adult) (pediatric): Secondary | ICD-10-CM | POA: Diagnosis not present

## 2018-05-22 LAB — LIPID PANEL
Cholesterol: 107 mg/dL (ref 0–200)
HDL: 46.2 mg/dL (ref >39.00)
LDL Cholesterol: 53 mg/dL (ref 0–99)
NonHDL: 60.62
Total CHOL/HDL Ratio: 2
Triglycerides: 36 mg/dL (ref 0.0–149.0)
VLDL: 7.2 mg/dL (ref 0.0–40.0)

## 2018-05-22 LAB — COMPREHENSIVE METABOLIC PANEL
ALK PHOS: 77 U/L (ref 39–117)
ALT: 38 U/L (ref 0–53)
AST: 32 U/L (ref 0–37)
Albumin: 4.4 g/dL (ref 3.5–5.2)
BILIRUBIN TOTAL: 1.3 mg/dL — AB (ref 0.2–1.2)
BUN: 19 mg/dL (ref 6–23)
CO2: 30 mEq/L (ref 19–32)
Calcium: 9.6 mg/dL (ref 8.4–10.5)
Chloride: 101 mEq/L (ref 96–112)
Creatinine, Ser: 1.12 mg/dL (ref 0.40–1.50)
GFR: 70.92 mL/min (ref >60.00)
Glucose, Bld: 104 mg/dL — ABNORMAL HIGH (ref 70–99)
Potassium: 3.8 mEq/L (ref 3.5–5.1)
SODIUM: 139 meq/L (ref 135–145)
TOTAL PROTEIN: 6.7 g/dL (ref 6.0–8.3)

## 2018-05-22 LAB — PSA: PSA: 1.16 ng/mL (ref 0.10–4.00)

## 2018-05-22 LAB — HEMOGLOBIN A1C: Hgb A1c MFr Bld: 5.7 % (ref 4.6–6.5)

## 2018-05-22 NOTE — Patient Instructions (Signed)

## 2018-05-22 NOTE — Progress Notes (Signed)
Subjective:  Patient ID: Cory Joyce, male    DOB: 11-06-1955  Age: 62 y.o. MRN: [id number]  CC: Annual Exam   HPI Cory Joyce presents for a CPX.  He complains that for the last year he has experienced DOE and dizziness when he climbs a hill.  He does not experience this when he is walking on a flat surface.  He denies chest pain, fatigue, or diaphoresis.  He is also concerned about a lesion on the bridge of his nose that has been present for several months.  Outpatient Medications Prior to Visit  Medication Sig Dispense Refill  . aspirin 81 MG tablet Take 81 mg by mouth daily.    Marland Kitchen atorvastatin (LIPITOR) 40 MG tablet Take 1 tablet (40 mg total) by mouth daily. 30 tablet 0  . Methylphenidate 25.9 MG TBED Take 2 tablets by mouth daily with breakfast.      No facility-administered medications prior to visit.     ROS Review of Systems  Constitutional: Negative.  Negative for appetite change, diaphoresis, fatigue and unexpected weight change.  HENT: Negative.  Negative for trouble swallowing.   Eyes: Negative for visual disturbance.  Respiratory: Positive for shortness of breath. Negative for chest tightness and wheezing.   Cardiovascular: Negative for chest pain, palpitations and leg swelling.  Gastrointestinal: Negative for abdominal pain, constipation, diarrhea, nausea and vomiting.  Endocrine: Negative.   Genitourinary: Negative.  Negative for difficulty urinating, dysuria, penile pain, penile swelling, scrotal swelling and testicular pain.  Musculoskeletal: Negative.  Negative for arthralgias, gait problem and myalgias.  Skin: Negative.  Negative for color change.  Neurological: Positive for dizziness. Negative for weakness, light-headedness and numbness.  Hematological: Negative for adenopathy. Does not bruise/bleed easily.  Psychiatric/Behavioral: Negative.     Objective:  BP 124/80 (BP Location: Left Arm, Patient Position: Sitting, Cuff Size:  Normal)   Pulse 74   Temp 97.8 F (36.6 C) (Oral)   Resp 16   Ht 5\' 10"  (1.778 m)   Wt 186 lb 8 oz (84.6 kg)   SpO2 98%   BMI 26.76 kg/m   BP Readings from Last 3 Encounters:  05/22/18 124/80  01/04/18 126/80  12/29/16 126/76    Wt Readings from Last 3 Encounters:  05/22/18 186 lb 8 oz (84.6 kg)  01/04/18 199 lb 6.4 oz (90.4 kg)  12/29/16 191 lb 3.2 oz (86.7 kg)    Physical Exam  Constitutional: He is oriented to person, place, and time. No distress.  HENT:  Head:    Mouth/Throat: Oropharynx is clear and moist. No oropharyngeal exudate.  Eyes: Conjunctivae are normal. No scleral icterus.  Neck: Normal range of motion. Neck supple. No JVD present. No thyromegaly present.  Cardiovascular: Normal rate, regular rhythm and normal heart sounds. Exam reveals no gallop and no friction rub.  No murmur heard. EKG ---  Sinus  Rhythm  WITHIN NORMAL LIMITS - no change from the prior EKG   Pulmonary/Chest: Effort normal and breath sounds normal. No respiratory distress. He has no wheezes. He has no rales.  Abdominal: Soft. Normal appearance and bowel sounds are normal. He exhibits no mass. There is no hepatosplenomegaly. There is no tenderness. Hernia confirmed negative in the right inguinal area and confirmed negative in the left inguinal area.  Genitourinary: Rectum normal, prostate normal, testes normal and penis normal. Rectal exam shows no external hemorrhoid, no internal hemorrhoid, no fissure, no mass, no tenderness, anal tone normal and guaiac negative stool. Prostate is not enlarged  and not tender. Right testis shows no mass, no swelling and no tenderness. Left testis shows no mass, no swelling and no tenderness. Circumcised. No penile erythema or penile tenderness. No discharge found.  Musculoskeletal: Normal range of motion. He exhibits no edema, tenderness or deformity.  Lymphadenopathy:    He has no cervical adenopathy. No inguinal adenopathy noted on the right or left  side.  Neurological: He is alert and oriented to person, place, and time.  Skin: Skin is warm and dry. No rash noted. He is not diaphoretic. No erythema.  Vitals reviewed.   Lab Results  Component Value Date   WBC 7.2 11/22/2016   HGB 14.1 11/22/2016   HCT 40.2 11/22/2016   PLT 314.0 11/22/2016   GLUCOSE 104 (H) 05/22/2018   CHOL 107 05/22/2018   TRIG 36.0 05/22/2018   HDL 46.20 05/22/2018   LDLDIRECT 136.1 09/11/2010   LDLCALC 53 05/22/2018   ALT 38 05/22/2018   AST 32 05/22/2018   NA 139 05/22/2018   K 3.8 05/22/2018   CL 101 05/22/2018   CREATININE 1.12 05/22/2018   BUN 19 05/22/2018   CO2 30 05/22/2018   TSH 0.84 11/22/2016   PSA 1.16 05/22/2018   HGBA1C 5.7 05/22/2018    Mr Shoulder Right W Contrast  Result Date: 09/15/2017 CLINICAL DATA:  Chronic right shoulder pain. EXAM: MR ARTHROGRAM OF THE RIGHT SHOULDER TECHNIQUE: Multiplanar, multisequence MR imaging of the right shoulder was performed following the administration of intra-articular contrast. CONTRAST:  See Injection Documentation. COMPARISON:  None. FINDINGS: Rotator cuff: Mild tendinosis of the supraspinatus and infraspinatus tendons. Teres minor tendon is intact. Subscapularis tendon is intact. Muscles: No atrophy or fatty replacement of nor abnormal signal within, the muscles of the rotator cuff. Biceps long head: Intact. Acromioclavicular Joint: Mild arthropathy of the acromioclavicular joint with marrow edema on either side of the joint. Type I acromion. Trace subacromial/ subdeltoid bursal fluid. Glenohumeral Joint: Intraarticular contrast distending the joint capsule. No chondral defect. Normal glenohumeral ligaments. Labrum: Superior posterior labral tear. Small amount of contrast undercutting the superior anterior labrum which may reflect a prominent sulcus versus a tear given its continuity with the superior posterior labral tear. Bones: No focal marrow signal abnormality. No fracture or dislocation.  IMPRESSION: 1. Superior posterior labral tear. Small amount of contrast undercutting the superior anterior labrum which may reflect a prominent sulcus versus a tear given its continuity with the superior posterior labral tear. 2. Mild tendinosis of the supraspinatus and infraspinatus tendons. Electronically Signed   By: Kathreen Devoid   On: 09/15/2017 16:33   Arthrogram  Result Date: 09/15/2017 CLINICAL DATA:  Right shoulder pain. EXAM: RIGHT SHOULDER INJECTION UNDER FLUOROSCOPY TECHNIQUE: An appropriate skin entrance site was determined. The site was marked, prepped with Betadine, draped in the usual sterile fashion, and infiltrated locally with buffered Lidocaine. A 22 gauge spinal needle was advanced to the superomedial margin of the humeral head under intermittent fluoroscopy. 1 mL of 1% lidocaine injected easily. A mixture of 0.05 mL of MultiHance, 5 mL of 1% lidocaine, and 15 mL of Isovue-M 200 was then used to opacify the right shoulder capsule. 12 mL of this mixture were injected. No immediate complication. FLUOROSCOPY TIME:  Fluoroscopy Time:  2 seconds Radiation Exposure Index (if provided by the fluoroscopic device): 10.77 microGray*m^2 Number of Acquired Spot Images: 0 IMPRESSION: Technically successful right shoulder injection for MRI. Electronically Signed   By: Logan Bores M.D.   On: 09/15/2017 16:26    Assessment &  Plan:   Nikolas was seen today for annual exam.  Diagnoses and all orders for this visit:  Routine general medical examination at a health care facility- Exam completed, labs reviewed, vaccines reviewed, screening for colon cancer is up-to-date, patient education material was given. -     Lipid panel; Future -     PSA; Future  Hyperglycemia- He has very mild prediabetes.  Medical therapy is not indicated. -     Comprehensive metabolic panel; Future -     Hemoglobin A1c; Future  Hyperlipidemia with target LDL less than 130- He has achieved his LDL goal and is doing  well on the statin. -     Comprehensive metabolic panel; Future  DOE (dyspnea on exertion)- His EKG is negative for ischemia.  I have asked him to undergo an exercise tolerance test to screen for CAD. -     EKG 12-Lead -     EXERCISE TOLERANCE TEST (ETT); Future  Basal cell carcinoma (BCC), unspecified site -     Ambulatory referral to Dermatology   I am having Cory Joyce maintain his aspirin, Methylphenidate, and atorvastatin.  No orders of the defined types were placed in this encounter.    Follow-up: Return if symptoms worsen or fail to improve.  Scarlette Calico, MD

## 2018-05-25 DIAGNOSIS — F4322 Adjustment disorder with anxiety: Secondary | ICD-10-CM | POA: Diagnosis not present

## 2018-05-27 ENCOUNTER — Other Ambulatory Visit: Payer: Self-pay | Admitting: Internal Medicine

## 2018-05-27 DIAGNOSIS — E785 Hyperlipidemia, unspecified: Secondary | ICD-10-CM

## 2018-05-30 ENCOUNTER — Ambulatory Visit (INDEPENDENT_AMBULATORY_CARE_PROVIDER_SITE_OTHER): Payer: BLUE CROSS/BLUE SHIELD

## 2018-05-30 ENCOUNTER — Encounter: Payer: Self-pay | Admitting: Internal Medicine

## 2018-05-30 DIAGNOSIS — R0609 Other forms of dyspnea: Secondary | ICD-10-CM

## 2018-05-30 LAB — EXERCISE TOLERANCE TEST
CSEPED: 11 min
CSEPHR: 102 %
Estimated workload: 13.5 METS
Exercise duration (sec): 0 s
MPHR: 160 {beats}/min
Peak HR: 164 {beats}/min
RPE: 16
Rest HR: 81 {beats}/min

## 2018-06-01 DIAGNOSIS — F900 Attention-deficit hyperactivity disorder, predominantly inattentive type: Secondary | ICD-10-CM | POA: Diagnosis not present

## 2018-06-06 DIAGNOSIS — L280 Lichen simplex chronicus: Secondary | ICD-10-CM | POA: Diagnosis not present

## 2018-06-06 DIAGNOSIS — X32XXXA Exposure to sunlight, initial encounter: Secondary | ICD-10-CM | POA: Diagnosis not present

## 2018-06-06 DIAGNOSIS — D1801 Hemangioma of skin and subcutaneous tissue: Secondary | ICD-10-CM | POA: Diagnosis not present

## 2018-06-06 DIAGNOSIS — L570 Actinic keratosis: Secondary | ICD-10-CM | POA: Diagnosis not present

## 2018-06-08 DIAGNOSIS — F4322 Adjustment disorder with anxiety: Secondary | ICD-10-CM | POA: Diagnosis not present

## 2018-06-13 ENCOUNTER — Telehealth: Payer: Self-pay

## 2018-06-13 NOTE — Telephone Encounter (Signed)
-----   Message from [facility], [place] sent at 05/22/2018  8:11 AM EDT ----- Regarding: FLUBLOK Call pt when this comes in.

## 2018-06-15 DIAGNOSIS — F900 Attention-deficit hyperactivity disorder, predominantly inattentive type: Secondary | ICD-10-CM | POA: Diagnosis not present

## 2018-06-19 DIAGNOSIS — M9903 Segmental and somatic dysfunction of lumbar region: Secondary | ICD-10-CM | POA: Diagnosis not present

## 2018-06-19 DIAGNOSIS — M5137 Other intervertebral disc degeneration, lumbosacral region: Secondary | ICD-10-CM | POA: Diagnosis not present

## 2018-06-19 DIAGNOSIS — M5441 Lumbago with sciatica, right side: Secondary | ICD-10-CM | POA: Diagnosis not present

## 2018-06-19 DIAGNOSIS — M542 Cervicalgia: Secondary | ICD-10-CM | POA: Diagnosis not present

## 2018-06-22 DIAGNOSIS — F4322 Adjustment disorder with anxiety: Secondary | ICD-10-CM | POA: Diagnosis not present

## 2018-07-06 DIAGNOSIS — F4322 Adjustment disorder with anxiety: Secondary | ICD-10-CM | POA: Diagnosis not present

## 2018-07-13 DIAGNOSIS — F900 Attention-deficit hyperactivity disorder, predominantly inattentive type: Secondary | ICD-10-CM | POA: Diagnosis not present

## 2018-07-17 DIAGNOSIS — M5441 Lumbago with sciatica, right side: Secondary | ICD-10-CM | POA: Diagnosis not present

## 2018-07-17 DIAGNOSIS — M542 Cervicalgia: Secondary | ICD-10-CM | POA: Diagnosis not present

## 2018-07-17 DIAGNOSIS — M5137 Other intervertebral disc degeneration, lumbosacral region: Secondary | ICD-10-CM | POA: Diagnosis not present

## 2018-07-17 DIAGNOSIS — M9903 Segmental and somatic dysfunction of lumbar region: Secondary | ICD-10-CM | POA: Diagnosis not present

## 2018-07-20 DIAGNOSIS — Z79899 Other long term (current) drug therapy: Secondary | ICD-10-CM | POA: Diagnosis not present

## 2018-07-20 DIAGNOSIS — F902 Attention-deficit hyperactivity disorder, combined type: Secondary | ICD-10-CM | POA: Diagnosis not present

## 2018-07-27 DIAGNOSIS — F4322 Adjustment disorder with anxiety: Secondary | ICD-10-CM | POA: Diagnosis not present

## 2018-07-27 DIAGNOSIS — F900 Attention-deficit hyperactivity disorder, predominantly inattentive type: Secondary | ICD-10-CM | POA: Diagnosis not present

## 2018-08-01 ENCOUNTER — Ambulatory Visit (INDEPENDENT_AMBULATORY_CARE_PROVIDER_SITE_OTHER): Payer: BLUE CROSS/BLUE SHIELD | Admitting: *Deleted

## 2018-08-01 DIAGNOSIS — Z23 Encounter for immunization: Secondary | ICD-10-CM | POA: Diagnosis not present

## 2018-08-08 DIAGNOSIS — F900 Attention-deficit hyperactivity disorder, predominantly inattentive type: Secondary | ICD-10-CM | POA: Diagnosis not present

## 2018-08-14 DIAGNOSIS — M542 Cervicalgia: Secondary | ICD-10-CM | POA: Diagnosis not present

## 2018-08-14 DIAGNOSIS — M5137 Other intervertebral disc degeneration, lumbosacral region: Secondary | ICD-10-CM | POA: Diagnosis not present

## 2018-08-14 DIAGNOSIS — M9903 Segmental and somatic dysfunction of lumbar region: Secondary | ICD-10-CM | POA: Diagnosis not present

## 2018-08-14 DIAGNOSIS — M5441 Lumbago with sciatica, right side: Secondary | ICD-10-CM | POA: Diagnosis not present

## 2018-08-29 DIAGNOSIS — F900 Attention-deficit hyperactivity disorder, predominantly inattentive type: Secondary | ICD-10-CM | POA: Diagnosis not present

## 2018-09-04 DIAGNOSIS — G4733 Obstructive sleep apnea (adult) (pediatric): Secondary | ICD-10-CM | POA: Diagnosis not present

## 2018-09-12 DIAGNOSIS — F900 Attention-deficit hyperactivity disorder, predominantly inattentive type: Secondary | ICD-10-CM | POA: Diagnosis not present

## 2018-09-14 DIAGNOSIS — M5137 Other intervertebral disc degeneration, lumbosacral region: Secondary | ICD-10-CM | POA: Diagnosis not present

## 2018-09-14 DIAGNOSIS — M9903 Segmental and somatic dysfunction of lumbar region: Secondary | ICD-10-CM | POA: Diagnosis not present

## 2018-09-14 DIAGNOSIS — M542 Cervicalgia: Secondary | ICD-10-CM | POA: Diagnosis not present

## 2018-09-14 DIAGNOSIS — M5441 Lumbago with sciatica, right side: Secondary | ICD-10-CM | POA: Diagnosis not present

## 2018-10-03 DIAGNOSIS — F900 Attention-deficit hyperactivity disorder, predominantly inattentive type: Secondary | ICD-10-CM | POA: Diagnosis not present

## 2018-10-10 DIAGNOSIS — M542 Cervicalgia: Secondary | ICD-10-CM | POA: Diagnosis not present

## 2018-10-10 DIAGNOSIS — M5137 Other intervertebral disc degeneration, lumbosacral region: Secondary | ICD-10-CM | POA: Diagnosis not present

## 2018-10-10 DIAGNOSIS — M5441 Lumbago with sciatica, right side: Secondary | ICD-10-CM | POA: Diagnosis not present

## 2018-10-10 DIAGNOSIS — M9903 Segmental and somatic dysfunction of lumbar region: Secondary | ICD-10-CM | POA: Diagnosis not present

## 2018-10-17 DIAGNOSIS — F900 Attention-deficit hyperactivity disorder, predominantly inattentive type: Secondary | ICD-10-CM | POA: Diagnosis not present

## 2018-10-24 DIAGNOSIS — G4733 Obstructive sleep apnea (adult) (pediatric): Secondary | ICD-10-CM | POA: Diagnosis not present

## 2018-10-24 DIAGNOSIS — F902 Attention-deficit hyperactivity disorder, combined type: Secondary | ICD-10-CM | POA: Diagnosis not present

## 2018-10-24 DIAGNOSIS — Z79899 Other long term (current) drug therapy: Secondary | ICD-10-CM | POA: Diagnosis not present

## 2018-10-24 DIAGNOSIS — F419 Anxiety disorder, unspecified: Secondary | ICD-10-CM | POA: Diagnosis not present

## 2018-10-31 DIAGNOSIS — F900 Attention-deficit hyperactivity disorder, predominantly inattentive type: Secondary | ICD-10-CM | POA: Diagnosis not present

## 2018-11-07 DIAGNOSIS — M5441 Lumbago with sciatica, right side: Secondary | ICD-10-CM | POA: Diagnosis not present

## 2018-11-07 DIAGNOSIS — M9903 Segmental and somatic dysfunction of lumbar region: Secondary | ICD-10-CM | POA: Diagnosis not present

## 2018-11-07 DIAGNOSIS — M542 Cervicalgia: Secondary | ICD-10-CM | POA: Diagnosis not present

## 2018-11-07 DIAGNOSIS — M5137 Other intervertebral disc degeneration, lumbosacral region: Secondary | ICD-10-CM | POA: Diagnosis not present

## 2018-11-14 ENCOUNTER — Other Ambulatory Visit: Payer: Self-pay | Admitting: Internal Medicine

## 2018-11-14 DIAGNOSIS — E785 Hyperlipidemia, unspecified: Secondary | ICD-10-CM

## 2018-11-21 DIAGNOSIS — F900 Attention-deficit hyperactivity disorder, predominantly inattentive type: Secondary | ICD-10-CM | POA: Diagnosis not present

## 2018-12-05 DIAGNOSIS — M9903 Segmental and somatic dysfunction of lumbar region: Secondary | ICD-10-CM | POA: Diagnosis not present

## 2018-12-05 DIAGNOSIS — M542 Cervicalgia: Secondary | ICD-10-CM | POA: Diagnosis not present

## 2018-12-05 DIAGNOSIS — M5441 Lumbago with sciatica, right side: Secondary | ICD-10-CM | POA: Diagnosis not present

## 2018-12-05 DIAGNOSIS — F900 Attention-deficit hyperactivity disorder, predominantly inattentive type: Secondary | ICD-10-CM | POA: Diagnosis not present

## 2018-12-05 DIAGNOSIS — M5137 Other intervertebral disc degeneration, lumbosacral region: Secondary | ICD-10-CM | POA: Diagnosis not present

## 2018-12-07 DIAGNOSIS — G4733 Obstructive sleep apnea (adult) (pediatric): Secondary | ICD-10-CM | POA: Diagnosis not present

## 2018-12-19 DIAGNOSIS — F900 Attention-deficit hyperactivity disorder, predominantly inattentive type: Secondary | ICD-10-CM | POA: Diagnosis not present

## 2018-12-21 IMAGING — XA DG FLUORO GUIDE NDL PLC/BX
3 series · 3 of 3 positions shown · IV contrast (multihance)
Comparison: none

CLINICAL DATA: Right shoulder pain.

EXAM:
RIGHT SHOULDER INJECTION UNDER FLUOROSCOPY
TECHNIQUE: An appropriate skin entrance site was determined. The site was
marked, prepped with Betadine, draped in the usual sterile fashion,
and infiltrated locally with buffered Lidocaine. A 22 gauge spinal
needle was advanced to the superomedial margin of the humeral head
under intermittent fluoroscopy. 1 mL of 1% lidocaine injected
easily. A mixture of 0.05 mL of MultiHance, 5 mL of 1% lidocaine,
and 15 mL of Isovue-M 200 was then used to opacify the right
shoulder capsule. 12 mL of this mixture were injected. No immediate
complication.
FLUOROSCOPY TIME:  Fluoroscopy Time:  2 seconds
Radiation Exposure Index (if provided by the fluoroscopic device):
10.77 microGray*m^2
Number of Acquired Spot Images: 0

[Series 1: ortho standard · 1 of 1 slices shown (1 of 3)]
[im 1/1]
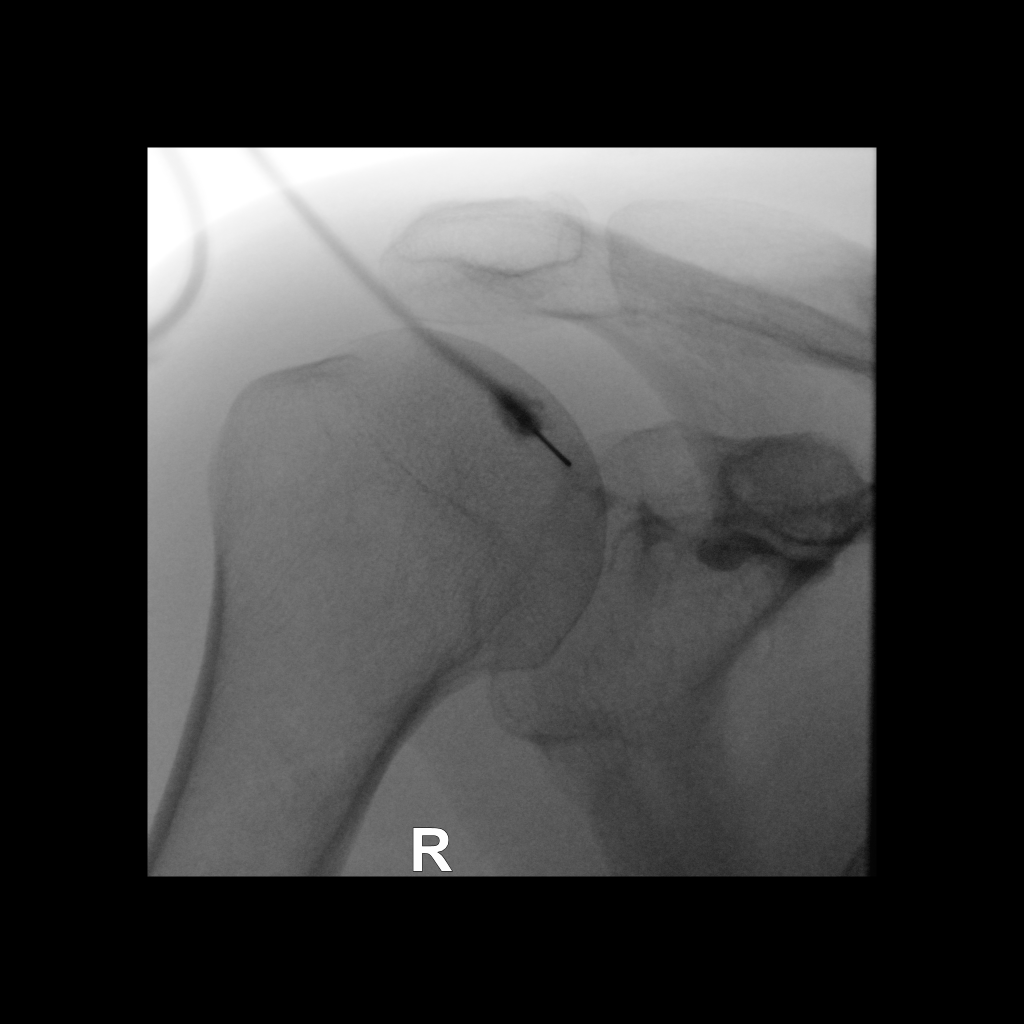

[Series 2: ortho standard · 1 of 1 slices shown (2 of 3)]
[im 1/1]
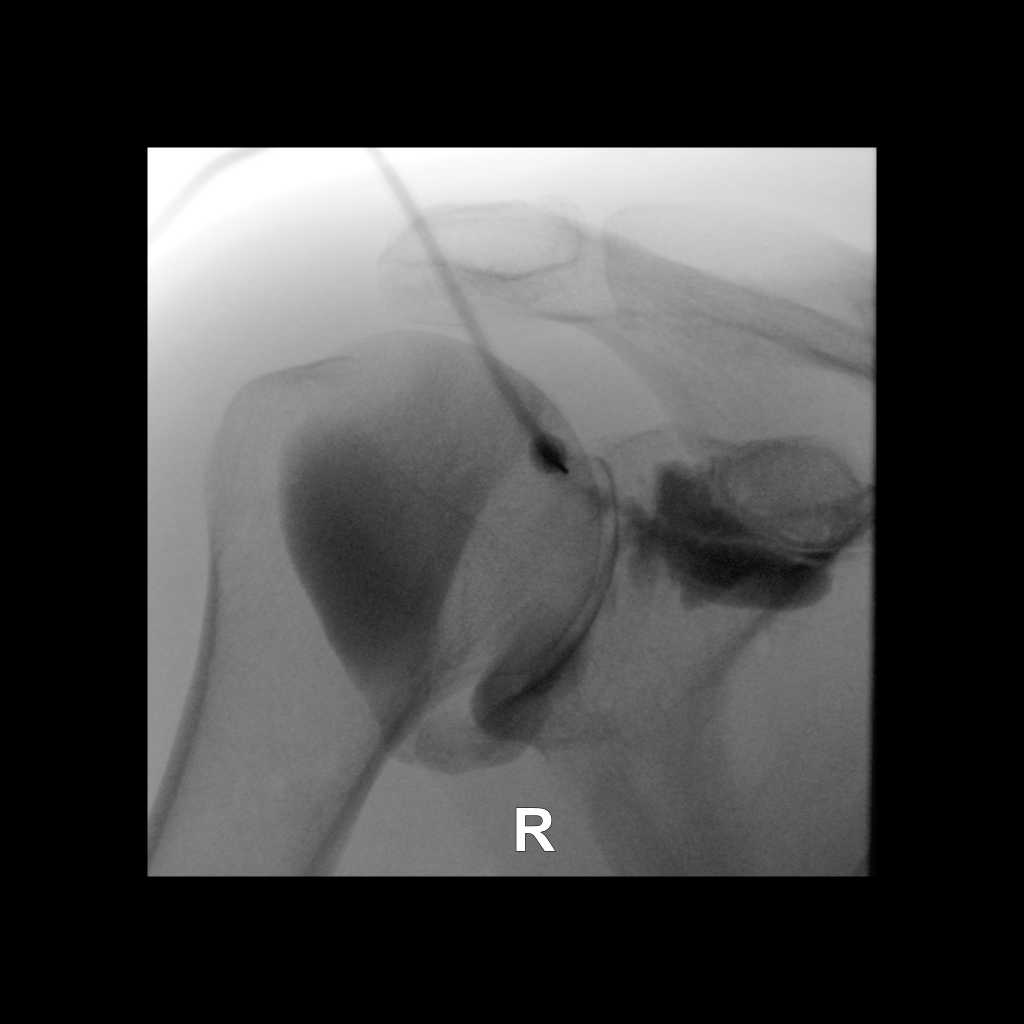

[Series 3: ortho standard · 1 of 1 slices shown (3 of 3)]
[im 1/1]
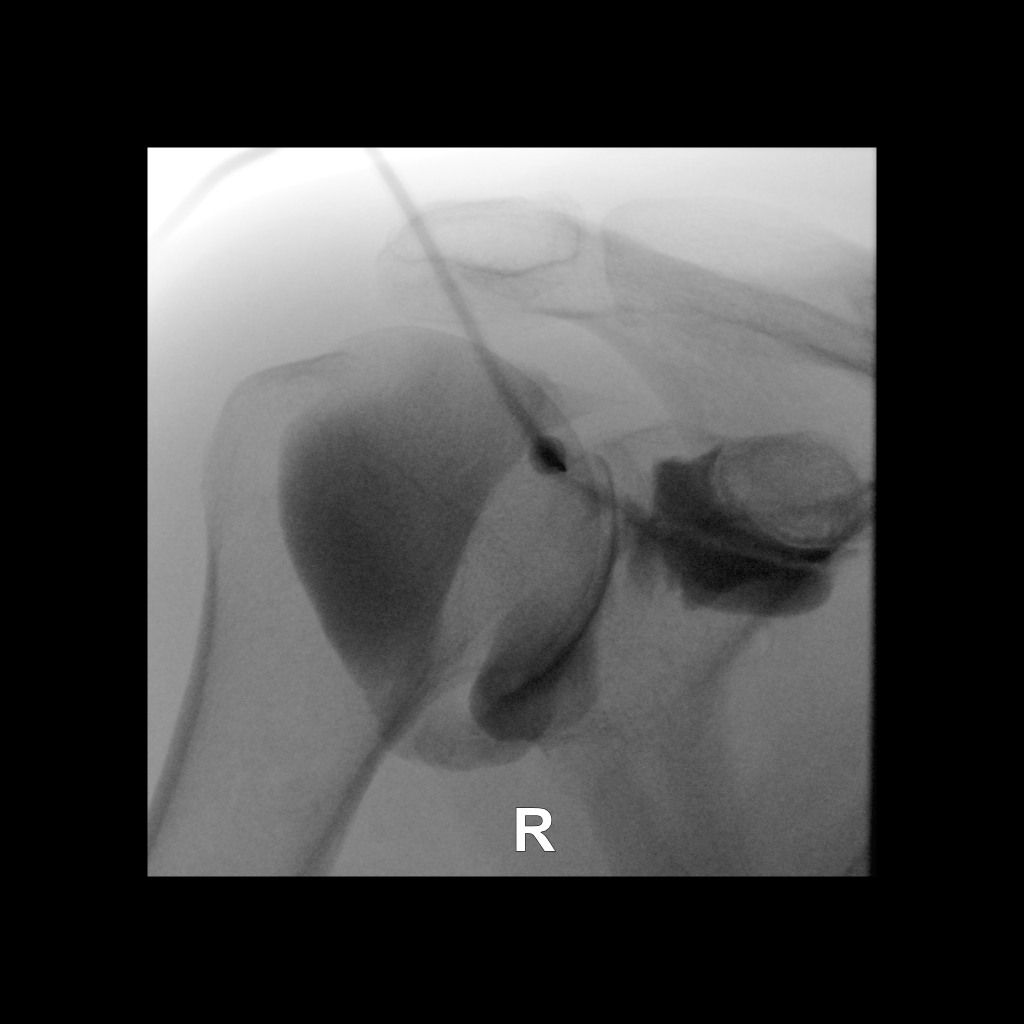

[3 of 3 positions shown; findings below may reference images not displayed]

IMPRESSION: Technically successful right shoulder injection for MRI.

## 2019-01-02 DIAGNOSIS — M5137 Other intervertebral disc degeneration, lumbosacral region: Secondary | ICD-10-CM | POA: Diagnosis not present

## 2019-01-02 DIAGNOSIS — F900 Attention-deficit hyperactivity disorder, predominantly inattentive type: Secondary | ICD-10-CM | POA: Diagnosis not present

## 2019-01-02 DIAGNOSIS — M9903 Segmental and somatic dysfunction of lumbar region: Secondary | ICD-10-CM | POA: Diagnosis not present

## 2019-01-02 DIAGNOSIS — M5441 Lumbago with sciatica, right side: Secondary | ICD-10-CM | POA: Diagnosis not present

## 2019-01-02 DIAGNOSIS — M542 Cervicalgia: Secondary | ICD-10-CM | POA: Diagnosis not present

## 2019-01-11 DIAGNOSIS — F902 Attention-deficit hyperactivity disorder, combined type: Secondary | ICD-10-CM | POA: Diagnosis not present

## 2019-01-11 DIAGNOSIS — Z79899 Other long term (current) drug therapy: Secondary | ICD-10-CM | POA: Diagnosis not present

## 2019-01-16 DIAGNOSIS — F900 Attention-deficit hyperactivity disorder, predominantly inattentive type: Secondary | ICD-10-CM | POA: Diagnosis not present

## 2019-01-30 DIAGNOSIS — F900 Attention-deficit hyperactivity disorder, predominantly inattentive type: Secondary | ICD-10-CM | POA: Diagnosis not present

## 2019-02-06 DIAGNOSIS — M9903 Segmental and somatic dysfunction of lumbar region: Secondary | ICD-10-CM | POA: Diagnosis not present

## 2019-02-06 DIAGNOSIS — M5441 Lumbago with sciatica, right side: Secondary | ICD-10-CM | POA: Diagnosis not present

## 2019-02-06 DIAGNOSIS — M5137 Other intervertebral disc degeneration, lumbosacral region: Secondary | ICD-10-CM | POA: Diagnosis not present

## 2019-02-06 DIAGNOSIS — M542 Cervicalgia: Secondary | ICD-10-CM | POA: Diagnosis not present

## 2019-02-13 DIAGNOSIS — F900 Attention-deficit hyperactivity disorder, predominantly inattentive type: Secondary | ICD-10-CM | POA: Diagnosis not present

## 2019-02-26 DIAGNOSIS — F900 Attention-deficit hyperactivity disorder, predominantly inattentive type: Secondary | ICD-10-CM | POA: Diagnosis not present

## 2019-03-06 DIAGNOSIS — M9903 Segmental and somatic dysfunction of lumbar region: Secondary | ICD-10-CM | POA: Diagnosis not present

## 2019-03-06 DIAGNOSIS — M542 Cervicalgia: Secondary | ICD-10-CM | POA: Diagnosis not present

## 2019-03-06 DIAGNOSIS — M5441 Lumbago with sciatica, right side: Secondary | ICD-10-CM | POA: Diagnosis not present

## 2019-03-06 DIAGNOSIS — M5137 Other intervertebral disc degeneration, lumbosacral region: Secondary | ICD-10-CM | POA: Diagnosis not present

## 2019-03-20 DIAGNOSIS — F900 Attention-deficit hyperactivity disorder, predominantly inattentive type: Secondary | ICD-10-CM | POA: Diagnosis not present

## 2019-04-03 DIAGNOSIS — M542 Cervicalgia: Secondary | ICD-10-CM | POA: Diagnosis not present

## 2019-04-03 DIAGNOSIS — M5137 Other intervertebral disc degeneration, lumbosacral region: Secondary | ICD-10-CM | POA: Diagnosis not present

## 2019-04-03 DIAGNOSIS — M5441 Lumbago with sciatica, right side: Secondary | ICD-10-CM | POA: Diagnosis not present

## 2019-04-03 DIAGNOSIS — M9903 Segmental and somatic dysfunction of lumbar region: Secondary | ICD-10-CM | POA: Diagnosis not present

## 2019-04-13 DIAGNOSIS — Z79899 Other long term (current) drug therapy: Secondary | ICD-10-CM | POA: Diagnosis not present

## 2019-04-13 DIAGNOSIS — G4733 Obstructive sleep apnea (adult) (pediatric): Secondary | ICD-10-CM | POA: Diagnosis not present

## 2019-04-13 DIAGNOSIS — F902 Attention-deficit hyperactivity disorder, combined type: Secondary | ICD-10-CM | POA: Diagnosis not present

## 2019-04-13 DIAGNOSIS — F419 Anxiety disorder, unspecified: Secondary | ICD-10-CM | POA: Diagnosis not present

## 2019-04-20 DIAGNOSIS — F419 Anxiety disorder, unspecified: Secondary | ICD-10-CM | POA: Diagnosis not present

## 2019-04-24 DIAGNOSIS — F900 Attention-deficit hyperactivity disorder, predominantly inattentive type: Secondary | ICD-10-CM | POA: Diagnosis not present

## 2019-05-01 DIAGNOSIS — M9903 Segmental and somatic dysfunction of lumbar region: Secondary | ICD-10-CM | POA: Diagnosis not present

## 2019-05-01 DIAGNOSIS — M542 Cervicalgia: Secondary | ICD-10-CM | POA: Diagnosis not present

## 2019-05-01 DIAGNOSIS — M5137 Other intervertebral disc degeneration, lumbosacral region: Secondary | ICD-10-CM | POA: Diagnosis not present

## 2019-05-01 DIAGNOSIS — M5441 Lumbago with sciatica, right side: Secondary | ICD-10-CM | POA: Diagnosis not present

## 2019-05-04 DIAGNOSIS — F419 Anxiety disorder, unspecified: Secondary | ICD-10-CM | POA: Diagnosis not present

## 2019-05-11 ENCOUNTER — Other Ambulatory Visit: Payer: Self-pay | Admitting: Internal Medicine

## 2019-05-11 DIAGNOSIS — E785 Hyperlipidemia, unspecified: Secondary | ICD-10-CM

## 2019-05-18 DIAGNOSIS — F419 Anxiety disorder, unspecified: Secondary | ICD-10-CM | POA: Diagnosis not present

## 2019-05-22 ENCOUNTER — Telehealth: Payer: Self-pay | Admitting: Internal Medicine

## 2019-05-22 NOTE — Telephone Encounter (Signed)
Medication: atorvastatin (LIPITOR) 40 MG tablet   Patient is requesting a refill.    [facility]armacy[id number] [address], [place]

## 2019-05-22 NOTE — Telephone Encounter (Signed)
Pt needs an appointment before any refills can be sent in.   Can you call pt if you have a chance?

## 2019-05-23 NOTE — Telephone Encounter (Signed)
CPE appt made  °

## 2019-05-24 ENCOUNTER — Encounter: Payer: Self-pay | Admitting: Internal Medicine

## 2019-05-24 ENCOUNTER — Ambulatory Visit (INDEPENDENT_AMBULATORY_CARE_PROVIDER_SITE_OTHER): Payer: BC Managed Care – PPO | Admitting: Internal Medicine

## 2019-05-24 ENCOUNTER — Other Ambulatory Visit: Payer: Self-pay

## 2019-05-24 VITALS — BP 132/78 | HR 75 | Temp 98.1°F | Resp 16 | Ht 70.0 in | Wt 199.0 lb

## 2019-05-24 DIAGNOSIS — Z23 Encounter for immunization: Secondary | ICD-10-CM | POA: Diagnosis not present

## 2019-05-24 DIAGNOSIS — Z0000 Encounter for general adult medical examination without abnormal findings: Secondary | ICD-10-CM | POA: Diagnosis not present

## 2019-05-24 DIAGNOSIS — R739 Hyperglycemia, unspecified: Secondary | ICD-10-CM

## 2019-05-24 DIAGNOSIS — E785 Hyperlipidemia, unspecified: Secondary | ICD-10-CM

## 2019-05-24 NOTE — Patient Instructions (Signed)

## 2019-05-24 NOTE — Progress Notes (Addendum)
Subjective:  Patient ID: Cory Joyce, male    DOB: 1956/09/08  Age: 63 y.o. MRN: [id number]  CC: Annual Exam   HPI [name] presents for a CPX.  He complains of weight gain.  He has not been very active over the last few months.  He denies any recent episodes of CP, DOE, palpitations, edema, or fatigue.  Outpatient Medications Prior to Visit  Medication Sig Dispense Refill  . aspirin 81 MG tablet Take 81 mg by mouth daily.    Marland Kitchen atorvastatin (LIPITOR) 40 MG tablet TAKE 1 TABLET BY MOUTH EVERY DAY 30 tablet 0  . Methylphenidate 25.9 MG TBED Take 2 tablets by mouth daily with breakfast.      No facility-administered medications prior to visit.     ROS Review of Systems  Constitutional: Positive for unexpected weight change. Negative for diaphoresis and fatigue.  HENT: Negative.  Negative for trouble swallowing.   Eyes: Negative for visual disturbance.  Respiratory: Negative for cough, chest tightness, shortness of breath and wheezing.   Cardiovascular: Negative for chest pain, palpitations and leg swelling.  Gastrointestinal: Negative for abdominal pain, constipation, diarrhea, nausea and vomiting.  Genitourinary: Negative.  Negative for difficulty urinating, penile swelling, scrotal swelling, testicular pain and urgency.  Musculoskeletal: Negative.  Negative for arthralgias and myalgias.  Skin: Negative.  Negative for color change.  Neurological: Negative.  Negative for dizziness, weakness, light-headedness and headaches.  Hematological: Negative for adenopathy. Does not bruise/bleed easily.  Psychiatric/Behavioral: Negative.     Objective:  BP 132/78 (BP Location: Left Arm, Patient Position: Sitting, Cuff Size: Normal)   Pulse 75   Temp 98.1 F (36.7 C) (Oral)   Resp 16   Ht 5\' 10"  (1.778 m)   Wt 199 lb (90.3 kg)   SpO2 98%   BMI 28.55 kg/m   BP Readings from Last 3 Encounters:  05/24/19 132/78  05/22/18 124/80  01/04/18 126/80    Wt  Readings from Last 3 Encounters:  05/24/19 199 lb (90.3 kg)  05/22/18 186 lb 8 oz (84.6 kg)  01/04/18 199 lb 6.4 oz (90.4 kg)    Physical Exam Vitals signs reviewed.  Constitutional:      Appearance: He is obese. He is not ill-appearing or diaphoretic.  HENT:     Nose: Nose normal.     Mouth/Throat:     Mouth: Mucous membranes are moist.     Pharynx: No oropharyngeal exudate.  Eyes:     General: No scleral icterus.    Conjunctiva/sclera: Conjunctivae normal.  Neck:     Musculoskeletal: Normal range of motion and neck supple. No neck rigidity.  Cardiovascular:     Rate and Rhythm: Normal rate and regular rhythm.     Heart sounds: No murmur.  Pulmonary:     Effort: Pulmonary effort is normal.     Breath sounds: No stridor. No wheezing, rhonchi or rales.  Abdominal:     General: Abdomen is flat. Bowel sounds are normal. There is no distension.     Palpations: There is no hepatomegaly, splenomegaly or mass.     Tenderness: There is no abdominal tenderness.  Musculoskeletal: Normal range of motion.     Right lower leg: No edema.     Left lower leg: No edema.  Skin:    General: Skin is warm and dry.     Coloration: Skin is not pale.  Neurological:     General: No focal deficit present.     Mental Status: He  is alert and oriented to person, place, and time. Mental status is at baseline.  Psychiatric:        Mood and Affect: Mood normal.        Behavior: Behavior normal.        Thought Content: Thought content normal.        Judgment: Judgment normal.     Lab Results  Component Value Date   WBC 6.4 05/29/2019   HGB 15.4 05/29/2019   HCT 45.5 05/29/2019   PLT 287.0 05/29/2019   GLUCOSE 110 (H) 05/29/2019   CHOL 123 05/29/2019   TRIG 51.0 05/29/2019   HDL 47.70 05/29/2019   LDLDIRECT 136.1 09/11/2010   LDLCALC 65 05/29/2019   ALT 26 05/29/2019   AST 20 05/29/2019   NA 141 05/29/2019   K 4.2 05/29/2019   CL 103 05/29/2019   CREATININE 1.10 05/29/2019   BUN 18  05/29/2019   CO2 30 05/29/2019   TSH 1.40 05/29/2019   PSA 0.52 05/29/2019   HGBA1C 5.8 05/29/2019    Mr Shoulder Right W Contrast  Result Date: 09/15/2017 CLINICAL DATA:  Chronic right shoulder pain. EXAM: MR ARTHROGRAM OF THE RIGHT SHOULDER TECHNIQUE: Multiplanar, multisequence MR imaging of the right shoulder was performed following the administration of intra-articular contrast. CONTRAST:  See Injection Documentation. COMPARISON:  None. FINDINGS: Rotator cuff: Mild tendinosis of the supraspinatus and infraspinatus tendons. Teres minor tendon is intact. Subscapularis tendon is intact. Muscles: No atrophy or fatty replacement of nor abnormal signal within, the muscles of the rotator cuff. Biceps long head: Intact. Acromioclavicular Joint: Mild arthropathy of the acromioclavicular joint with marrow edema on either side of the joint. Type I acromion. Trace subacromial/ subdeltoid bursal fluid. Glenohumeral Joint: Intraarticular contrast distending the joint capsule. No chondral defect. Normal glenohumeral ligaments. Labrum: Superior posterior labral tear. Small amount of contrast undercutting the superior anterior labrum which may reflect a prominent sulcus versus a tear given its continuity with the superior posterior labral tear. Bones: No focal marrow signal abnormality. No fracture or dislocation. IMPRESSION: 1. Superior posterior labral tear. Small amount of contrast undercutting the superior anterior labrum which may reflect a prominent sulcus versus a tear given its continuity with the superior posterior labral tear. 2. Mild tendinosis of the supraspinatus and infraspinatus tendons. Electronically Signed   By: Kathreen Devoid   On: 09/15/2017 16:33   Arthrogram  Result Date: 09/15/2017 CLINICAL DATA:  Right shoulder pain. EXAM: RIGHT SHOULDER INJECTION UNDER FLUOROSCOPY TECHNIQUE: An appropriate skin entrance site was determined. The site was marked, prepped with Betadine, draped in the usual  sterile fashion, and infiltrated locally with buffered Lidocaine. A 22 gauge spinal needle was advanced to the superomedial margin of the humeral head under intermittent fluoroscopy. 1 mL of 1% lidocaine injected easily. A mixture of 0.05 mL of MultiHance, 5 mL of 1% lidocaine, and 15 mL of Isovue-M 200 was then used to opacify the right shoulder capsule. 12 mL of this mixture were injected. No immediate complication. FLUOROSCOPY TIME:  Fluoroscopy Time:  2 seconds Radiation Exposure Index (if provided by the fluoroscopic device): 10.77 microGray*m^2 Number of Acquired Spot Images: 0 IMPRESSION: Technically successful right shoulder injection for MRI. Electronically Signed   By: Logan Bores M.D.   On: 09/15/2017 16:26    Assessment & Plan:   Jaques was seen today for annual exam.  Diagnoses and all orders for this visit:  Need for influenza vaccination -     Cancel: Flu vaccine, recombinant, quadrivalent, inj (  Flublok quad egg free) -     Cancel: Flu Vaccine MDCK QUAD PF -     Flu Vaccine QUAD 36+ mos IM  Hyperglycemia-I will monitor his blood sugar and screen for prediabetes/diabetes. -     Basic metabolic panel; Future -     Hemoglobin A1c; Future  Hyperlipidemia with target LDL less than 130-tolerating the statin well.  I will monitor his lipid panel, liver enzymes, and TSH. -     CBC with Differential/Platelet; Future -     Hepatic function panel; Future -     TSH; Future  Routine general medical examination at a health care facility- Exam completed, labs reviewed, vaccines updated, screening for colon cancer up-to-date, he was encouraged to improve his lifestyle modifications, patient education was given. -     Lipid panel; Future -     PSA; Future   I am having Hardie Pulley "Ted" maintain his aspirin, Methylphenidate, and atorvastatin.  No orders of the defined types were placed in this encounter.    Follow-up: Return in about 1 year (around 05/23/2020).  Scarlette Calico, MD

## 2019-05-25 DIAGNOSIS — F419 Anxiety disorder, unspecified: Secondary | ICD-10-CM | POA: Diagnosis not present

## 2019-05-29 ENCOUNTER — Other Ambulatory Visit (INDEPENDENT_AMBULATORY_CARE_PROVIDER_SITE_OTHER): Payer: BC Managed Care – PPO

## 2019-05-29 ENCOUNTER — Encounter: Payer: Self-pay | Admitting: Internal Medicine

## 2019-05-29 DIAGNOSIS — E785 Hyperlipidemia, unspecified: Secondary | ICD-10-CM | POA: Diagnosis not present

## 2019-05-29 DIAGNOSIS — Z0000 Encounter for general adult medical examination without abnormal findings: Secondary | ICD-10-CM | POA: Diagnosis not present

## 2019-05-29 DIAGNOSIS — Z125 Encounter for screening for malignant neoplasm of prostate: Secondary | ICD-10-CM

## 2019-05-29 DIAGNOSIS — R739 Hyperglycemia, unspecified: Secondary | ICD-10-CM | POA: Diagnosis not present

## 2019-05-29 LAB — HEMOGLOBIN A1C: Hgb A1c MFr Bld: 5.8 % (ref 4.6–6.5)

## 2019-05-29 LAB — BASIC METABOLIC PANEL
BUN: 18 mg/dL (ref 6–23)
CO2: 30 mEq/L (ref 19–32)
Calcium: 9.4 mg/dL (ref 8.4–10.5)
Chloride: 103 mEq/L (ref 96–112)
Creatinine, Ser: 1.1 mg/dL (ref 0.40–1.50)
GFR: 67.9 mL/min (ref >60.00)
Glucose, Bld: 110 mg/dL — ABNORMAL HIGH (ref 70–99)
Potassium: 4.2 mEq/L (ref 3.5–5.1)
Sodium: 141 mEq/L (ref 135–145)

## 2019-05-29 LAB — CBC WITH DIFFERENTIAL/PLATELET
Basophils Absolute: 0.1 10*3/uL (ref 0.0–0.1)
Basophils Relative: 1 % (ref 0.0–3.0)
Eosinophils Absolute: 0.3 10*3/uL (ref 0.0–0.7)
Eosinophils Relative: 4.9 % (ref 0.0–5.0)
HCT: 45.5 % (ref 39.0–52.0)
Hemoglobin: 15.4 g/dL (ref 13.0–17.0)
Lymphocytes Relative: 47.9 % — ABNORMAL HIGH (ref 12.0–46.0)
Lymphs Abs: 3.1 10*3/uL (ref 0.7–4.0)
MCHC: 33.7 g/dL (ref 30.0–36.0)
MCV: 85.4 fl (ref 78.0–100.0)
Monocytes Absolute: 0.6 10*3/uL (ref 0.1–1.0)
Monocytes Relative: 10 % (ref 3.0–12.0)
Neutro Abs: 2.3 10*3/uL (ref 1.4–7.7)
Neutrophils Relative %: 36.2 % — ABNORMAL LOW (ref 43.0–77.0)
Platelets: 287 10*3/uL (ref 150.0–400.0)
RBC: 5.33 Mil/uL (ref 4.22–5.81)
RDW: 15 % (ref 11.5–15.5)
WBC: 6.4 10*3/uL (ref 4.0–10.5)

## 2019-05-29 LAB — LIPID PANEL
Cholesterol: 123 mg/dL (ref 0–200)
HDL: 47.7 mg/dL (ref >39.00)
LDL Cholesterol: 65 mg/dL (ref 0–99)
NonHDL: 74.9
Total CHOL/HDL Ratio: 3
Triglycerides: 51 mg/dL (ref 0.0–149.0)
VLDL: 10.2 mg/dL (ref 0.0–40.0)

## 2019-05-29 LAB — TSH: TSH: 1.4 u[IU]/mL (ref 0.35–4.50)

## 2019-05-29 LAB — HEPATIC FUNCTION PANEL
ALT: 26 U/L (ref 0–53)
AST: 20 U/L (ref 0–37)
Albumin: 4.3 g/dL (ref 3.5–5.2)
Alkaline Phosphatase: 83 U/L (ref 39–117)
Bilirubin, Direct: 0.1 mg/dL (ref 0.0–0.3)
Total Bilirubin: 0.7 mg/dL (ref 0.2–1.2)
Total Protein: 6.6 g/dL (ref 6.0–8.3)

## 2019-05-29 LAB — PSA: PSA: 0.52 ng/mL (ref 0.10–4.00)

## 2019-05-31 DIAGNOSIS — M5137 Other intervertebral disc degeneration, lumbosacral region: Secondary | ICD-10-CM | POA: Diagnosis not present

## 2019-05-31 DIAGNOSIS — M9903 Segmental and somatic dysfunction of lumbar region: Secondary | ICD-10-CM | POA: Diagnosis not present

## 2019-05-31 DIAGNOSIS — M542 Cervicalgia: Secondary | ICD-10-CM | POA: Diagnosis not present

## 2019-05-31 DIAGNOSIS — M5441 Lumbago with sciatica, right side: Secondary | ICD-10-CM | POA: Diagnosis not present

## 2019-06-01 ENCOUNTER — Other Ambulatory Visit: Payer: Self-pay | Admitting: Internal Medicine

## 2019-06-01 DIAGNOSIS — E785 Hyperlipidemia, unspecified: Secondary | ICD-10-CM

## 2019-06-01 MED ORDER — ATORVASTATIN CALCIUM 40 MG PO TABS: 40.0000 mg | ORAL_TABLET | Freq: Every day | ORAL | 3 refills | Status: DC

## 2019-06-05 DIAGNOSIS — G4733 Obstructive sleep apnea (adult) (pediatric): Secondary | ICD-10-CM | POA: Diagnosis not present

## 2019-06-08 DIAGNOSIS — F419 Anxiety disorder, unspecified: Secondary | ICD-10-CM | POA: Diagnosis not present

## 2019-06-15 DIAGNOSIS — F419 Anxiety disorder, unspecified: Secondary | ICD-10-CM | POA: Diagnosis not present

## 2019-06-25 DIAGNOSIS — F419 Anxiety disorder, unspecified: Secondary | ICD-10-CM | POA: Diagnosis not present

## 2019-07-03 DIAGNOSIS — M5137 Other intervertebral disc degeneration, lumbosacral region: Secondary | ICD-10-CM | POA: Diagnosis not present

## 2019-07-03 DIAGNOSIS — M5441 Lumbago with sciatica, right side: Secondary | ICD-10-CM | POA: Diagnosis not present

## 2019-07-03 DIAGNOSIS — M9903 Segmental and somatic dysfunction of lumbar region: Secondary | ICD-10-CM | POA: Diagnosis not present

## 2019-07-03 DIAGNOSIS — M542 Cervicalgia: Secondary | ICD-10-CM | POA: Diagnosis not present

## 2019-07-06 DIAGNOSIS — Z79899 Other long term (current) drug therapy: Secondary | ICD-10-CM | POA: Diagnosis not present

## 2019-07-06 DIAGNOSIS — F902 Attention-deficit hyperactivity disorder, combined type: Secondary | ICD-10-CM | POA: Diagnosis not present

## 2019-07-06 DIAGNOSIS — G4733 Obstructive sleep apnea (adult) (pediatric): Secondary | ICD-10-CM | POA: Diagnosis not present

## 2019-07-06 DIAGNOSIS — F419 Anxiety disorder, unspecified: Secondary | ICD-10-CM | POA: Diagnosis not present

## 2019-07-13 DIAGNOSIS — F419 Anxiety disorder, unspecified: Secondary | ICD-10-CM | POA: Diagnosis not present

## 2019-08-03 DIAGNOSIS — F419 Anxiety disorder, unspecified: Secondary | ICD-10-CM | POA: Diagnosis not present

## 2019-08-08 DIAGNOSIS — M5137 Other intervertebral disc degeneration, lumbosacral region: Secondary | ICD-10-CM | POA: Diagnosis not present

## 2019-08-08 DIAGNOSIS — M9903 Segmental and somatic dysfunction of lumbar region: Secondary | ICD-10-CM | POA: Diagnosis not present

## 2019-08-08 DIAGNOSIS — M5441 Lumbago with sciatica, right side: Secondary | ICD-10-CM | POA: Diagnosis not present

## 2019-08-08 DIAGNOSIS — M542 Cervicalgia: Secondary | ICD-10-CM | POA: Diagnosis not present

## 2019-08-10 DIAGNOSIS — F419 Anxiety disorder, unspecified: Secondary | ICD-10-CM | POA: Diagnosis not present

## 2019-08-17 DIAGNOSIS — F419 Anxiety disorder, unspecified: Secondary | ICD-10-CM | POA: Diagnosis not present

## 2019-08-31 DIAGNOSIS — F4322 Adjustment disorder with anxiety: Secondary | ICD-10-CM | POA: Diagnosis not present

## 2019-09-04 DIAGNOSIS — M542 Cervicalgia: Secondary | ICD-10-CM | POA: Diagnosis not present

## 2019-09-04 DIAGNOSIS — M9903 Segmental and somatic dysfunction of lumbar region: Secondary | ICD-10-CM | POA: Diagnosis not present

## 2019-09-04 DIAGNOSIS — M5137 Other intervertebral disc degeneration, lumbosacral region: Secondary | ICD-10-CM | POA: Diagnosis not present

## 2019-09-04 DIAGNOSIS — M5441 Lumbago with sciatica, right side: Secondary | ICD-10-CM | POA: Diagnosis not present

## 2019-09-05 DIAGNOSIS — G4733 Obstructive sleep apnea (adult) (pediatric): Secondary | ICD-10-CM | POA: Diagnosis not present

## 2019-09-07 DIAGNOSIS — F4322 Adjustment disorder with anxiety: Secondary | ICD-10-CM | POA: Diagnosis not present

## 2019-09-14 DIAGNOSIS — F4322 Adjustment disorder with anxiety: Secondary | ICD-10-CM | POA: Diagnosis not present

## 2019-09-26 DIAGNOSIS — F902 Attention-deficit hyperactivity disorder, combined type: Secondary | ICD-10-CM | POA: Diagnosis not present

## 2019-09-26 DIAGNOSIS — Z79899 Other long term (current) drug therapy: Secondary | ICD-10-CM | POA: Diagnosis not present

## 2019-10-05 DIAGNOSIS — F4322 Adjustment disorder with anxiety: Secondary | ICD-10-CM | POA: Diagnosis not present

## 2019-10-12 DIAGNOSIS — F4322 Adjustment disorder with anxiety: Secondary | ICD-10-CM | POA: Diagnosis not present

## 2019-10-15 DIAGNOSIS — M5441 Lumbago with sciatica, right side: Secondary | ICD-10-CM | POA: Diagnosis not present

## 2019-10-15 DIAGNOSIS — M9901 Segmental and somatic dysfunction of cervical region: Secondary | ICD-10-CM | POA: Diagnosis not present

## 2019-10-15 DIAGNOSIS — M5137 Other intervertebral disc degeneration, lumbosacral region: Secondary | ICD-10-CM | POA: Diagnosis not present

## 2019-10-15 DIAGNOSIS — M9903 Segmental and somatic dysfunction of lumbar region: Secondary | ICD-10-CM | POA: Diagnosis not present

## 2019-10-19 DIAGNOSIS — F4322 Adjustment disorder with anxiety: Secondary | ICD-10-CM | POA: Diagnosis not present

## 2019-11-02 DIAGNOSIS — F4322 Adjustment disorder with anxiety: Secondary | ICD-10-CM | POA: Diagnosis not present

## 2019-11-09 DIAGNOSIS — F4322 Adjustment disorder with anxiety: Secondary | ICD-10-CM | POA: Diagnosis not present

## 2019-11-13 DIAGNOSIS — M9901 Segmental and somatic dysfunction of cervical region: Secondary | ICD-10-CM | POA: Diagnosis not present

## 2019-11-13 DIAGNOSIS — M5441 Lumbago with sciatica, right side: Secondary | ICD-10-CM | POA: Diagnosis not present

## 2019-11-13 DIAGNOSIS — M5137 Other intervertebral disc degeneration, lumbosacral region: Secondary | ICD-10-CM | POA: Diagnosis not present

## 2019-11-13 DIAGNOSIS — M9903 Segmental and somatic dysfunction of lumbar region: Secondary | ICD-10-CM | POA: Diagnosis not present

## 2019-11-16 DIAGNOSIS — F4322 Adjustment disorder with anxiety: Secondary | ICD-10-CM | POA: Diagnosis not present

## 2019-11-30 DIAGNOSIS — F4322 Adjustment disorder with anxiety: Secondary | ICD-10-CM | POA: Diagnosis not present

## 2019-12-10 DIAGNOSIS — G4733 Obstructive sleep apnea (adult) (pediatric): Secondary | ICD-10-CM | POA: Diagnosis not present

## 2019-12-11 DIAGNOSIS — M5441 Lumbago with sciatica, right side: Secondary | ICD-10-CM | POA: Diagnosis not present

## 2019-12-11 DIAGNOSIS — M9901 Segmental and somatic dysfunction of cervical region: Secondary | ICD-10-CM | POA: Diagnosis not present

## 2019-12-11 DIAGNOSIS — M5137 Other intervertebral disc degeneration, lumbosacral region: Secondary | ICD-10-CM | POA: Diagnosis not present

## 2019-12-11 DIAGNOSIS — M9903 Segmental and somatic dysfunction of lumbar region: Secondary | ICD-10-CM | POA: Diagnosis not present

## 2019-12-14 DIAGNOSIS — H52203 Unspecified astigmatism, bilateral: Secondary | ICD-10-CM | POA: Diagnosis not present

## 2019-12-14 DIAGNOSIS — H04123 Dry eye syndrome of bilateral lacrimal glands: Secondary | ICD-10-CM | POA: Diagnosis not present

## 2019-12-14 DIAGNOSIS — F4322 Adjustment disorder with anxiety: Secondary | ICD-10-CM | POA: Diagnosis not present

## 2019-12-21 DIAGNOSIS — F4322 Adjustment disorder with anxiety: Secondary | ICD-10-CM | POA: Diagnosis not present

## 2019-12-24 DIAGNOSIS — Z79899 Other long term (current) drug therapy: Secondary | ICD-10-CM | POA: Diagnosis not present

## 2019-12-24 DIAGNOSIS — F419 Anxiety disorder, unspecified: Secondary | ICD-10-CM | POA: Diagnosis not present

## 2019-12-24 DIAGNOSIS — G4733 Obstructive sleep apnea (adult) (pediatric): Secondary | ICD-10-CM | POA: Diagnosis not present

## 2019-12-24 DIAGNOSIS — F902 Attention-deficit hyperactivity disorder, combined type: Secondary | ICD-10-CM | POA: Diagnosis not present

## 2019-12-28 DIAGNOSIS — F4322 Adjustment disorder with anxiety: Secondary | ICD-10-CM | POA: Diagnosis not present

## 2020-01-04 DIAGNOSIS — F4322 Adjustment disorder with anxiety: Secondary | ICD-10-CM | POA: Diagnosis not present

## 2020-01-08 DIAGNOSIS — M9903 Segmental and somatic dysfunction of lumbar region: Secondary | ICD-10-CM | POA: Diagnosis not present

## 2020-01-08 DIAGNOSIS — M5441 Lumbago with sciatica, right side: Secondary | ICD-10-CM | POA: Diagnosis not present

## 2020-01-08 DIAGNOSIS — M9901 Segmental and somatic dysfunction of cervical region: Secondary | ICD-10-CM | POA: Diagnosis not present

## 2020-01-08 DIAGNOSIS — M5137 Other intervertebral disc degeneration, lumbosacral region: Secondary | ICD-10-CM | POA: Diagnosis not present

## 2020-01-25 DIAGNOSIS — F4322 Adjustment disorder with anxiety: Secondary | ICD-10-CM | POA: Diagnosis not present

## 2020-02-01 DIAGNOSIS — F4322 Adjustment disorder with anxiety: Secondary | ICD-10-CM | POA: Diagnosis not present

## 2020-02-05 DIAGNOSIS — M5441 Lumbago with sciatica, right side: Secondary | ICD-10-CM | POA: Diagnosis not present

## 2020-02-05 DIAGNOSIS — M9901 Segmental and somatic dysfunction of cervical region: Secondary | ICD-10-CM | POA: Diagnosis not present

## 2020-02-05 DIAGNOSIS — M5137 Other intervertebral disc degeneration, lumbosacral region: Secondary | ICD-10-CM | POA: Diagnosis not present

## 2020-02-05 DIAGNOSIS — M9903 Segmental and somatic dysfunction of lumbar region: Secondary | ICD-10-CM | POA: Diagnosis not present

## 2020-02-08 DIAGNOSIS — F4322 Adjustment disorder with anxiety: Secondary | ICD-10-CM | POA: Diagnosis not present

## 2020-02-15 DIAGNOSIS — F4322 Adjustment disorder with anxiety: Secondary | ICD-10-CM | POA: Diagnosis not present

## 2020-02-22 DIAGNOSIS — F4322 Adjustment disorder with anxiety: Secondary | ICD-10-CM | POA: Diagnosis not present

## 2020-02-29 DIAGNOSIS — F4322 Adjustment disorder with anxiety: Secondary | ICD-10-CM | POA: Diagnosis not present

## 2020-03-04 DIAGNOSIS — M5137 Other intervertebral disc degeneration, lumbosacral region: Secondary | ICD-10-CM | POA: Diagnosis not present

## 2020-03-04 DIAGNOSIS — M5441 Lumbago with sciatica, right side: Secondary | ICD-10-CM | POA: Diagnosis not present

## 2020-03-04 DIAGNOSIS — M9903 Segmental and somatic dysfunction of lumbar region: Secondary | ICD-10-CM | POA: Diagnosis not present

## 2020-03-04 DIAGNOSIS — M9901 Segmental and somatic dysfunction of cervical region: Secondary | ICD-10-CM | POA: Diagnosis not present

## 2020-03-07 DIAGNOSIS — F4322 Adjustment disorder with anxiety: Secondary | ICD-10-CM | POA: Diagnosis not present

## 2020-03-11 DIAGNOSIS — G4733 Obstructive sleep apnea (adult) (pediatric): Secondary | ICD-10-CM | POA: Diagnosis not present

## 2020-03-21 DIAGNOSIS — F4322 Adjustment disorder with anxiety: Secondary | ICD-10-CM | POA: Diagnosis not present

## 2020-03-25 DIAGNOSIS — F419 Anxiety disorder, unspecified: Secondary | ICD-10-CM | POA: Diagnosis not present

## 2020-03-25 DIAGNOSIS — Z79899 Other long term (current) drug therapy: Secondary | ICD-10-CM | POA: Diagnosis not present

## 2020-03-25 DIAGNOSIS — G4733 Obstructive sleep apnea (adult) (pediatric): Secondary | ICD-10-CM | POA: Diagnosis not present

## 2020-03-25 DIAGNOSIS — F902 Attention-deficit hyperactivity disorder, combined type: Secondary | ICD-10-CM | POA: Diagnosis not present

## 2020-03-28 DIAGNOSIS — F4322 Adjustment disorder with anxiety: Secondary | ICD-10-CM | POA: Diagnosis not present

## 2020-04-01 DIAGNOSIS — M9903 Segmental and somatic dysfunction of lumbar region: Secondary | ICD-10-CM | POA: Diagnosis not present

## 2020-04-01 DIAGNOSIS — M9901 Segmental and somatic dysfunction of cervical region: Secondary | ICD-10-CM | POA: Diagnosis not present

## 2020-04-01 DIAGNOSIS — M5441 Lumbago with sciatica, right side: Secondary | ICD-10-CM | POA: Diagnosis not present

## 2020-04-01 DIAGNOSIS — M5137 Other intervertebral disc degeneration, lumbosacral region: Secondary | ICD-10-CM | POA: Diagnosis not present

## 2020-04-04 DIAGNOSIS — F4322 Adjustment disorder with anxiety: Secondary | ICD-10-CM | POA: Diagnosis not present

## 2020-04-07 DIAGNOSIS — M5137 Other intervertebral disc degeneration, lumbosacral region: Secondary | ICD-10-CM | POA: Diagnosis not present

## 2020-04-07 DIAGNOSIS — M9901 Segmental and somatic dysfunction of cervical region: Secondary | ICD-10-CM | POA: Diagnosis not present

## 2020-04-07 DIAGNOSIS — M9903 Segmental and somatic dysfunction of lumbar region: Secondary | ICD-10-CM | POA: Diagnosis not present

## 2020-04-07 DIAGNOSIS — M5441 Lumbago with sciatica, right side: Secondary | ICD-10-CM | POA: Diagnosis not present

## 2020-04-11 DIAGNOSIS — F4322 Adjustment disorder with anxiety: Secondary | ICD-10-CM | POA: Diagnosis not present

## 2020-04-15 DIAGNOSIS — M9903 Segmental and somatic dysfunction of lumbar region: Secondary | ICD-10-CM | POA: Diagnosis not present

## 2020-04-15 DIAGNOSIS — M9901 Segmental and somatic dysfunction of cervical region: Secondary | ICD-10-CM | POA: Diagnosis not present

## 2020-04-15 DIAGNOSIS — M5137 Other intervertebral disc degeneration, lumbosacral region: Secondary | ICD-10-CM | POA: Diagnosis not present

## 2020-04-15 DIAGNOSIS — M5441 Lumbago with sciatica, right side: Secondary | ICD-10-CM | POA: Diagnosis not present

## 2020-04-22 DIAGNOSIS — M5137 Other intervertebral disc degeneration, lumbosacral region: Secondary | ICD-10-CM | POA: Diagnosis not present

## 2020-04-22 DIAGNOSIS — M9901 Segmental and somatic dysfunction of cervical region: Secondary | ICD-10-CM | POA: Diagnosis not present

## 2020-04-22 DIAGNOSIS — M5441 Lumbago with sciatica, right side: Secondary | ICD-10-CM | POA: Diagnosis not present

## 2020-04-22 DIAGNOSIS — M9903 Segmental and somatic dysfunction of lumbar region: Secondary | ICD-10-CM | POA: Diagnosis not present

## 2020-04-25 DIAGNOSIS — F4322 Adjustment disorder with anxiety: Secondary | ICD-10-CM | POA: Diagnosis not present

## 2020-05-08 ENCOUNTER — Other Ambulatory Visit: Payer: Self-pay | Admitting: Internal Medicine

## 2020-05-08 DIAGNOSIS — E785 Hyperlipidemia, unspecified: Secondary | ICD-10-CM

## 2020-07-31 ENCOUNTER — Other Ambulatory Visit: Payer: Self-pay | Admitting: Internal Medicine

## 2020-07-31 DIAGNOSIS — E785 Hyperlipidemia, unspecified: Secondary | ICD-10-CM

## 2020-08-05 ENCOUNTER — Ambulatory Visit (INDEPENDENT_AMBULATORY_CARE_PROVIDER_SITE_OTHER): Payer: BC Managed Care – PPO | Admitting: Internal Medicine

## 2020-08-05 ENCOUNTER — Encounter: Payer: Self-pay | Admitting: Internal Medicine

## 2020-08-05 ENCOUNTER — Other Ambulatory Visit: Payer: Self-pay

## 2020-08-05 VITALS — BP 128/78 | HR 66 | Temp 98.2°F | Ht 70.0 in | Wt 192.0 lb

## 2020-08-05 DIAGNOSIS — Z0000 Encounter for general adult medical examination without abnormal findings: Secondary | ICD-10-CM

## 2020-08-05 DIAGNOSIS — Z23 Encounter for immunization: Secondary | ICD-10-CM | POA: Diagnosis not present

## 2020-08-05 DIAGNOSIS — L03032 Cellulitis of left toe: Secondary | ICD-10-CM

## 2020-08-05 DIAGNOSIS — R739 Hyperglycemia, unspecified: Secondary | ICD-10-CM

## 2020-08-05 DIAGNOSIS — E785 Hyperlipidemia, unspecified: Secondary | ICD-10-CM | POA: Diagnosis not present

## 2020-08-05 DIAGNOSIS — R200 Anesthesia of skin: Secondary | ICD-10-CM | POA: Diagnosis not present

## 2020-08-05 MED ORDER — ATORVASTATIN CALCIUM 40 MG PO TABS: 40.0000 mg | ORAL_TABLET | Freq: Every day | ORAL | 0 refills | Status: DC

## 2020-08-05 MED ORDER — SULFAMETHOXAZOLE-TRIMETHOPRIM 800-160 MG PO TABS: 1.0000 | ORAL_TABLET | Freq: Two times a day (BID) | ORAL | 0 refills | Status: AC

## 2020-08-05 NOTE — Progress Notes (Signed)
Subjective:  Patient ID: Cory Joyce, male    DOB: 09/19/1956  Age: 64 y.o. MRN: [id number]  CC: Annual Exam  This visit occurred during the SARS-CoV-2 public health emergency.  Safety protocols were in place, including screening questions prior to the visit, additional usage of staff PPE, and extensive cleaning of exam room while observing appropriate contact time as indicated for disinfecting solutions.    HPI [name] presents for a CPX.  1.  He complains of a 44-month history of numbness isolated to his left thumb.  There is no weakness or incoordination.  He does repetitive activities but does not experience wrist or hand pain.  He has been seeing a chiropractor for low back pain and has done some E stimulation for the upper extremity which has not helped.  He does not experience neck pain.  2.  He is tolerating his statin well with no muscle or joint aches.  He is active and denies any recent episodes of chest pain, shortness of breath, palpitations, edema, or fatigue.  3.  He complains of a 2-day history of pain, redness, and swelling over his left third left third toe.  He denies trauma or injury.  He denies fever or chills.  Outpatient Medications Prior to Visit  Medication Sig Dispense Refill  . aspirin 81 MG tablet Take 81 mg by mouth daily.    . Methylphenidate 25.9 MG TBED Take 2 tablets by mouth daily with breakfast.     . atorvastatin (LIPITOR) 40 MG tablet TAKE 1 TABLET BY MOUTH EVERY DAY 90 tablet 0   No facility-administered medications prior to visit.    ROS Review of Systems  Constitutional: Negative.  Negative for appetite change, diaphoresis, fatigue and unexpected weight change.  HENT: Negative.   Eyes: Negative for visual disturbance.  Respiratory: Negative for cough, shortness of breath and wheezing.   Cardiovascular: Negative for chest pain, palpitations and leg swelling.  Gastrointestinal: Negative for abdominal pain,  constipation, diarrhea, nausea and vomiting.  Endocrine: Negative.   Genitourinary: Negative.  Negative for difficulty urinating, hematuria, scrotal swelling and testicular pain.  Musculoskeletal: Negative for arthralgias and myalgias.  Skin: Positive for color change. Negative for rash and wound.  Neurological: Positive for numbness. Negative for dizziness, weakness and light-headedness.  Hematological: Negative.  Negative for adenopathy. Does not bruise/bleed easily.  Psychiatric/Behavioral: Negative.     Objective:  BP 128/78   Pulse 66   Temp 98.2 F (36.8 C) (Oral)   Ht 5\' 10"  (1.778 m)   Wt 192 lb (87.1 kg)   SpO2 98%   BMI 27.55 kg/m   BP Readings from Last 3 Encounters:  08/05/20 128/78  05/24/19 132/78  05/22/18 124/80    Wt Readings from Last 3 Encounters:  08/05/20 192 lb (87.1 kg)  05/24/19 199 lb (90.3 kg)  05/22/18 186 lb 8 oz (84.6 kg)    Physical Exam Vitals reviewed.  Constitutional:      General: He is not in acute distress.    Appearance: Normal appearance. He is not toxic-appearing or diaphoretic.  HENT:     Nose: Nose normal.     Mouth/Throat:     Mouth: Mucous membranes are moist.  Eyes:     General: No scleral icterus.    Conjunctiva/sclera: Conjunctivae normal.  Cardiovascular:     Rate and Rhythm: Normal rate and regular rhythm.     Heart sounds: No murmur heard.  No friction rub. No gallop.  Pulmonary:     Effort: Pulmonary effort is normal.     Breath sounds: No stridor. No wheezing, rhonchi or rales.  Abdominal:     General: Abdomen is flat. Bowel sounds are normal. There is no distension.     Palpations: Abdomen is soft. There is no hepatomegaly, splenomegaly or mass.     Tenderness: There is no abdominal tenderness.     Hernia: There is no hernia in the left inguinal area or right inguinal area.  Genitourinary:    Pubic Area: No rash.      Penis: Normal.      Testes: Normal.        Right: Mass not present.     Epididymis:      Right: Normal. No mass.     Left: Normal. No mass.     Prostate: Normal. Not enlarged, not tender and no nodules present.     Rectum: Normal. Guaiac result negative. No mass, tenderness, anal fissure, external hemorrhoid or internal hemorrhoid. Normal anal tone.  Musculoskeletal:        General: Swelling and tenderness present. No deformity. Normal range of motion.     Right hand: Normal.     Left hand: No swelling, deformity, tenderness or bony tenderness. Normal range of motion. Normal strength. Normal sensation.     Cervical back: Neck supple.     Right foot: Normal.     Left foot: Swelling present.       Legs:     Comments: Negative Phalen's and Tinel's tests over both wrists.  Lymphadenopathy:     Cervical: No cervical adenopathy.     Lower Body: No right inguinal adenopathy. No left inguinal adenopathy.  Skin:    Findings: Erythema present.  Neurological:     General: No focal deficit present.     Mental Status: He is alert and oriented to person, place, and time. Mental status is at baseline.  Psychiatric:        Mood and Affect: Mood normal.        Behavior: Behavior normal.     Lab Results  Component Value Date   WBC 7.4 08/05/2020   HGB 16.5 08/05/2020   HCT 47.9 08/05/2020   PLT 314.0 08/05/2020   GLUCOSE 96 08/05/2020   CHOL 126 08/05/2020   TRIG 60.0 08/05/2020   HDL 44.70 08/05/2020   LDLDIRECT 136.1 09/11/2010   LDLCALC 70 08/05/2020   ALT 25 08/05/2020   AST 20 08/05/2020   NA 141 08/05/2020   K 4.1 08/05/2020   CL 102 08/05/2020   CREATININE 1.05 08/05/2020   BUN 13 08/05/2020   CO2 33 (H) 08/05/2020   TSH 1.03 08/05/2020   PSA 0.61 08/05/2020   HGBA1C 5.8 08/05/2020    MR SHOULDER RIGHT W CONTRAST  Result Date: 09/15/2017 CLINICAL DATA:  Chronic right shoulder pain. EXAM: MR ARTHROGRAM OF THE RIGHT SHOULDER TECHNIQUE: Multiplanar, multisequence MR imaging of the right shoulder was performed following the administration of intra-articular  contrast. CONTRAST:  See Injection Documentation. COMPARISON:  None. FINDINGS: Rotator cuff: Mild tendinosis of the supraspinatus and infraspinatus tendons. Teres minor tendon is intact. Subscapularis tendon is intact. Muscles: No atrophy or fatty replacement of nor abnormal signal within, the muscles of the rotator cuff. Biceps long head: Intact. Acromioclavicular Joint: Mild arthropathy of the acromioclavicular joint with marrow edema on either side of the joint. Type I acromion. Trace subacromial/ subdeltoid bursal fluid. Glenohumeral Joint: Intraarticular contrast distending the joint capsule. No chondral defect.  Normal glenohumeral ligaments. Labrum: Superior posterior labral tear. Small amount of contrast undercutting the superior anterior labrum which may reflect a prominent sulcus versus a tear given its continuity with the superior posterior labral tear. Bones: No focal marrow signal abnormality. No fracture or dislocation. IMPRESSION: 1. Superior posterior labral tear. Small amount of contrast undercutting the superior anterior labrum which may reflect a prominent sulcus versus a tear given its continuity with the superior posterior labral tear. 2. Mild tendinosis of the supraspinatus and infraspinatus tendons. Electronically Signed   By: Kathreen Devoid   On: 09/15/2017 16:33   Arthrogram  Result Date: 09/15/2017 CLINICAL DATA:  Right shoulder pain. EXAM: RIGHT SHOULDER INJECTION UNDER FLUOROSCOPY TECHNIQUE: An appropriate skin entrance site was determined. The site was marked, prepped with Betadine, draped in the usual sterile fashion, and infiltrated locally with buffered Lidocaine. A 22 gauge spinal needle was advanced to the superomedial margin of the humeral head under intermittent fluoroscopy. 1 mL of 1% lidocaine injected easily. A mixture of 0.05 mL of MultiHance, 5 mL of 1% lidocaine, and 15 mL of Isovue-M 200 was then used to opacify the right shoulder capsule. 12 mL of this mixture were  injected. No immediate complication. FLUOROSCOPY TIME:  Fluoroscopy Time:  2 seconds Radiation Exposure Index (if provided by the fluoroscopic device): 10.77 microGray*m^2 Number of Acquired Spot Images: 0 IMPRESSION: Technically successful right shoulder injection for MRI. Electronically Signed   By: Logan Bores M.D.   On: 09/15/2017 16:26    Assessment & Plan:   Krithik was seen today for annual exam.  Diagnoses and all orders for this visit:  Numbness of left hand- His B12 level is in the low normal range but I do not think it is low enough to cause numbness.  The rest of his labs are negative for secondary causes.  I have asked him to undergo an electrical study to screen for CTS, demyelination, and cervical radiculopathy. -     CBC with Differential/Platelet; Future -     Vitamin B12; Future -     Folate; Future -     Folate -     Vitamin B12 -     CBC with Differential/Platelet -     Ambulatory referral to Neurology  Hyperlipidemia with target LDL less than 130- He has achieved his LDL goal and is doing well on the statin. -     atorvastatin (LIPITOR) 40 MG tablet; Take 1 tablet (40 mg total) by mouth daily. -     TSH; Future -     Hepatic function panel; Future -     CK; Future -     CK -     Hepatic function panel -     TSH  Routine general medical examination at a health care facility- Exam completed, labs reviewed, vaccines reviewed and updated, cancer screenings are up-to-date. -     Lipid panel; Future -     PSA; Future -     PSA -     Lipid panel  Hyperglycemia- His A1c is normal at 5.8%. -     Basic metabolic panel; Future -     Hemoglobin A1c; Future -     Hemoglobin A1c -     Basic metabolic panel  Paronychia of third toe, left- This is most likely a staph infection.  Will treat with sulfamethoxazole/trimethoprim. -     sulfamethoxazole-trimethoprim (BACTRIM DS) 800-160 MG tablet; Take 1 tablet by mouth 2 (two) times daily for  7 days.  Other orders -      Flu Vaccine QUAD 6+ mos PF IM (Fluarix Quad PF)   I have changed Izell [place]. Madl "Ted"'s atorvastatin. I am also having him start on sulfamethoxazole-trimethoprim. Additionally, I am having him maintain his aspirin and Methylphenidate.  Meds ordered this encounter  Medications  . atorvastatin (LIPITOR) 40 MG tablet    Sig: Take 1 tablet (40 mg total) by mouth daily.    Dispense:  90 tablet    Refill:  0  . sulfamethoxazole-trimethoprim (BACTRIM DS) 800-160 MG tablet    Sig: Take 1 tablet by mouth 2 (two) times daily for 7 days.    Dispense:  14 tablet    Refill:  0   In addition to time spent on CPE, I spent 50 minutes in preparing to see the patient by review of recent labs, imaging and procedures, obtaining and reviewing separately obtained history, communicating with the patient and family or caregiver, ordering medications, tests or procedures, and documenting clinical information in the EHR including the differential Dx, treatment, and any further evaluation and other management of 1. Hyperlipidemia with target LDL less than 130 2. Numbness of left hand 3. Hyperglycemia 4. Paronychia of third toe, left     Follow-up: Return in about 6 months (around 02/02/2021).  Scarlette Calico, MD

## 2020-08-05 NOTE — Patient Instructions (Signed)

## 2020-08-06 LAB — HEPATIC FUNCTION PANEL
ALT: 25 U/L (ref 0–53)
AST: 20 U/L (ref 0–37)
Albumin: 4.5 g/dL (ref 3.5–5.2)
Alkaline Phosphatase: 84 U/L (ref 39–117)
Bilirubin, Direct: 0.2 mg/dL (ref 0.0–0.3)
Total Bilirubin: 1 mg/dL (ref 0.2–1.2)
Total Protein: 6.7 g/dL (ref 6.0–8.3)

## 2020-08-06 LAB — CBC WITH DIFFERENTIAL/PLATELET
Basophils Absolute: 0.1 10*3/uL (ref 0.0–0.1)
Basophils Relative: 0.7 % (ref 0.0–3.0)
Eosinophils Absolute: 0.2 10*3/uL (ref 0.0–0.7)
Eosinophils Relative: 2.3 % (ref 0.0–5.0)
HCT: 47.9 % (ref 39.0–52.0)
Hemoglobin: 16.5 g/dL (ref 13.0–17.0)
Lymphocytes Relative: 38.9 % (ref 12.0–46.0)
Lymphs Abs: 2.9 10*3/uL (ref 0.7–4.0)
MCHC: 34.4 g/dL (ref 30.0–36.0)
MCV: 86.8 fl (ref 78.0–100.0)
Monocytes Absolute: 0.6 10*3/uL (ref 0.1–1.0)
Monocytes Relative: 7.9 % (ref 3.0–12.0)
Neutro Abs: 3.7 10*3/uL (ref 1.4–7.7)
Neutrophils Relative %: 50.2 % (ref 43.0–77.0)
Platelets: 314 10*3/uL (ref 150.0–400.0)
RBC: 5.52 Mil/uL (ref 4.22–5.81)
RDW: 13.6 % (ref 11.5–15.5)
WBC: 7.4 10*3/uL (ref 4.0–10.5)

## 2020-08-06 LAB — LIPID PANEL
Cholesterol: 126 mg/dL (ref 0–200)
HDL: 44.7 mg/dL (ref >39.00)
LDL Cholesterol: 70 mg/dL (ref 0–99)
NonHDL: 81.74
Total CHOL/HDL Ratio: 3
Triglycerides: 60 mg/dL (ref 0.0–149.0)
VLDL: 12 mg/dL (ref 0.0–40.0)

## 2020-08-06 LAB — BASIC METABOLIC PANEL
BUN: 13 mg/dL (ref 6–23)
CO2: 33 mEq/L — ABNORMAL HIGH (ref 19–32)
Calcium: 9.6 mg/dL (ref 8.4–10.5)
Chloride: 102 mEq/L (ref 96–112)
Creatinine, Ser: 1.05 mg/dL (ref 0.40–1.50)
GFR: 75.89 mL/min (ref >60.00)
Glucose, Bld: 96 mg/dL (ref 70–99)
Potassium: 4.1 mEq/L (ref 3.5–5.1)
Sodium: 141 mEq/L (ref 135–145)

## 2020-08-06 LAB — FOLATE: Folate: 15.7 ng/mL (ref >5.9)

## 2020-08-06 LAB — PSA: PSA: 0.61 ng/mL (ref 0.10–4.00)

## 2020-08-06 LAB — VITAMIN B12: Vitamin B-12: 243 pg/mL (ref 211–911)

## 2020-08-06 LAB — HEMOGLOBIN A1C: Hgb A1c MFr Bld: 5.8 % (ref 4.6–6.5)

## 2020-08-06 LAB — TSH: TSH: 1.03 u[IU]/mL (ref 0.35–4.50)

## 2020-08-06 LAB — CK: Total CK: 214 U/L (ref 7–232)

## 2020-09-16 ENCOUNTER — Ambulatory Visit (INDEPENDENT_AMBULATORY_CARE_PROVIDER_SITE_OTHER): Payer: BC Managed Care – PPO | Admitting: Neurology

## 2020-09-16 ENCOUNTER — Encounter: Payer: Self-pay | Admitting: Neurology

## 2020-09-16 VITALS — BP 149/85 | HR 86 | Ht 70.0 in | Wt 191.0 lb

## 2020-09-16 DIAGNOSIS — R202 Paresthesia of skin: Secondary | ICD-10-CM

## 2020-09-16 DIAGNOSIS — E538 Deficiency of other specified B group vitamins: Secondary | ICD-10-CM | POA: Diagnosis not present

## 2020-09-16 DIAGNOSIS — R200 Anesthesia of skin: Secondary | ICD-10-CM | POA: Diagnosis not present

## 2020-09-16 MED ORDER — VITAMIN B-12 1000 MCG PO TABS: 1000.0000 ug | ORAL_TABLET | Freq: Every day | ORAL | 6 refills | Status: DC

## 2020-09-16 NOTE — Patient Instructions (Addendum)
Blood work EMG/NCS (see below) Start B12 Repeat levels in 4-8 weeks  Vitamin B12 Deficiency Vitamin B12 deficiency occurs when the body does not have enough vitamin B12, which is an important vitamin. The body needs this vitamin:  To make red blood cells.  To make DNA. This is the genetic material inside cells.  To help the nerves work properly so they can carry messages from the brain to the body. Vitamin B12 deficiency can cause various health problems, such as a low red blood cell count (anemia) or nerve damage. What are the causes? This condition may be caused by:  Not eating enough foods that contain vitamin B12.  Not having enough stomach acid and digestive fluids to properly absorb vitamin B12 from the food that you eat.  Certain digestive system diseases that make it hard to absorb vitamin B12. These diseases include Crohn's disease, chronic pancreatitis, and cystic fibrosis.  A condition in which the body does not make enough of a protein (intrinsic factor), resulting in too few red blood cells (pernicious anemia).  Having a surgery in which part of the stomach or small intestine is removed.  Taking certain medicines that make it hard for the body to absorb vitamin B12. These medicines include: ? Heartburn medicines (antacids and proton pump inhibitors). ? Certain antibiotic medicines. ? Some medicines that are used to treat diabetes, tuberculosis, gout, or high cholesterol. What increases the risk? The following factors may make you more likely to develop a B12 deficiency:  Being older than age 52.  Eating a vegetarian or vegan diet, especially while you are pregnant.  Eating a poor diet while you are pregnant.  Taking certain medicines.  Having alcoholism. What are the signs or symptoms? In some cases, there are no symptoms of this condition. If the condition leads to anemia or nerve damage, various symptoms can occur, such as:  Weakness.  Fatigue.  Loss  of appetite.  Weight loss.  Numbness or tingling in your hands and feet.  Redness and burning of the tongue.  Confusion or memory problems.  Depression.  Sensory problems, such as color blindness, ringing in the ears, or loss of taste.  Diarrhea or constipation.  Trouble walking. If anemia is severe, symptoms can include:  Shortness of breath.  Dizziness.  Rapid heart rate (tachycardia). How is this diagnosed? This condition may be diagnosed with a blood test to measure the level of vitamin B12 in your blood. You may also have other tests, including:  A group of tests that measure certain characteristics of blood cells (complete blood count, CBC).  A blood test to measure intrinsic factor.  A procedure where a thin tube with a camera on the end is used to look into your stomach or intestines (endoscopy). Other tests may be needed to discover the cause of B12 deficiency. How is this treated? Treatment for this condition depends on the cause. This condition may be treated by:  Changing your eating and drinking habits, such as: ? Eating more foods that contain vitamin B12. ? Drinking less alcohol or no alcohol.  Getting vitamin B12 injections.  Taking vitamin B12 supplements. Your health care provider will tell you which dosage is best for you. Follow these instructions at home: Eating and drinking   Eat lots of healthy foods that contain vitamin B12, including: ? Meats and poultry. This includes beef, pork, chicken, [place], and organ meats, such as liver. ? Seafood. This includes clams, rainbow trout, salmon, tuna, and haddock. ? Eggs. ?  Cereal and dairy products that are fortified. This means that vitamin B12 has been added to the food. Check the label on the package to see if the food is fortified. The items listed above may not be a complete list of recommended foods and beverages. Contact a dietitian for more information. General instructions  Get any  injections that are prescribed by your health care provider.  Take supplements only as told by your health care provider. Follow the directions carefully.  Do not drink alcohol if your health care provider tells you not to. In some cases, you may only be asked to limit alcohol use.  Keep all follow-up visits as told by your health care provider. This is important. Contact a health care provider if:  Your symptoms come back. Get help right away if you:  Develop shortness of breath.  Have a rapid heart rate.  Have chest pain.  Become dizzy or lose consciousness. Summary  Vitamin B12 deficiency occurs when the body does not have enough vitamin B12.  The main causes of vitamin B12 deficiency include dietary deficiency, digestive diseases, pernicious anemia, and having a surgery in which part of the stomach or small intestine is removed.  In some cases, there are no symptoms of this condition. If the condition leads to anemia or nerve damage, various symptoms can occur, such as weakness, shortness of breath, and numbness.  Treatment may include getting vitamin B12 injections or taking vitamin B12 supplements. Eat lots of healthy foods that contain vitamin B12. This information is not intended to replace advice given to you by your health care provider. Make sure you discuss any questions you have with your health care provider. Document Revised: 03/02/2019 Document Reviewed: 05/23/2018 Elsevier Patient Education  [place] tunnel syndrome is a condition that causes pain in your hand and arm. The carpal tunnel is a narrow area located on the palm side of your wrist. Repeated wrist motion or certain diseases may cause swelling within the tunnel. This swelling pinches the main nerve in the wrist (median nerve). What are the causes? This condition may be caused by:  Repeated wrist motions.  Wrist injuries.  Arthritis.  A cyst or tumor in  the carpal tunnel.  Fluid buildup during pregnancy. Sometimes the cause of this condition is not known. What increases the risk? The following factors may make you more likely to develop this condition:  Having a job, such as being a Research scientist (life sciences), that requires you to repeatedly move your wrist in the same motion.  Being a woman.  Having certain conditions, such as: ? Diabetes. ? Obesity. ? An underactive thyroid (hypothyroidism). ? Kidney failure. What are the signs or symptoms? Symptoms of this condition include:  A tingling feeling in your fingers, especially in your thumb, index, and middle fingers.  Tingling or numbness in your hand.  An aching feeling in your entire arm, especially when your wrist and elbow are bent for a long time.  Wrist pain that goes up your arm to your shoulder.  Pain that goes down into your palm or fingers.  A weak feeling in your hands. You may have trouble grabbing and holding items. Your symptoms may feel worse during the night. How is this diagnosed? This condition is diagnosed with a medical history and physical exam. You may also have tests, including:  Electromyogram (EMG). This test measures electrical signals sent by your nerves into the muscles.  Nerve conduction  study. This test measures how well electrical signals pass through your nerves.  Imaging tests, such as X-rays, ultrasound, and MRI. These tests check for possible causes of your condition. How is this treated? This condition may be treated with:  Lifestyle changes. It is important to stop or change the activity that caused your condition.  Doing exercise and activities to strengthen your muscles and bones (physical therapy).  Learning how to use your hand again after diagnosis (occupational therapy).  Medicines for pain and inflammation. This may include medicine that is injected into your wrist.  A wrist splint.  Surgery. Follow these instructions at  home: If you have a splint:  Wear the splint as told by your health care provider. Remove it only as told by your health care provider.  Loosen the splint if your fingers tingle, become numb, or turn cold and blue.  Keep the splint clean.  If the splint is not waterproof: ? Do not let it get wet. ? Cover it with a watertight covering when you take a bath or shower. Managing pain, stiffness, and swelling   If directed, put ice on the painful area: ? If you have a removable splint, remove it as told by your health care provider. ? Put ice in a plastic bag. ? Place a towel between your skin and the bag. ? Leave the ice on for 20 minutes, 2-3 times per day. General instructions  Take over-the-counter and prescription medicines only as told by your health care provider.  Rest your wrist from any activity that may be causing your pain. If your condition is work related, talk with your employer about changes that can be made, such as getting a wrist pad to use while typing.  Do any exercises as told by your health care provider, physical therapist, or occupational therapist.  Keep all follow-up visits as told by your health care provider. This is important. Contact a health care provider if:  You have new symptoms.  Your pain is not controlled with medicines.  Your symptoms get worse. Get help right away if:  You have severe numbness or tingling in your wrist or hand. Summary  Carpal tunnel syndrome is a condition that causes pain in your hand and arm.  It is usually caused by repeated wrist motions.  Lifestyle changes and medicines are used to treat carpal tunnel syndrome. Surgery may be recommended.  Follow your health care provider's instructions about wearing a splint, resting from activity, keeping follow-up visits, and calling for help. This information is not intended to replace advice given to you by your health care provider. Make sure you discuss any questions you  have with your health care provider. Document Revised: 01/20/2018 Document Reviewed: 01/20/2018 Elsevier Patient Education  2020 [place] is a test to check how well your muscles and nerves are working. This procedure includes the combined use of electromyogram (EMG) and nerve conduction study (NCS). EMG is used to look for muscular disorders. NCS, which is also called electroneurogram, measures how well your nerves are controlling your muscles. The procedures are usually done together to check if your muscles and nerves are healthy. If the results of the tests are abnormal, this may indicate disease or injury, such as a neuromuscular disease or peripheral nerve damage. Tell a health care provider about:  Any allergies you have.  All medicines you are taking, including vitamins, herbs, eye drops, creams, and over-the-counter medicines.  Any problems you or family members have  had with anesthetic medicines.  Any blood disorders you have.  Any surgeries you have had.  Any medical conditions you have.  If you have a pacemaker.  Whether you are pregnant or may be pregnant. What are the risks? Generally, this is a safe procedure. However, problems may occur, including:  Infection where the electrodes were inserted.  Bleeding. What happens before the procedure? Medicines Ask your health care provider about:  Changing or stopping your regular medicines. This is especially important if you are taking diabetes medicines or blood thinners.  Taking medicines such as aspirin and ibuprofen. These medicines can thin your blood. Do not take these medicines unless your health care provider tells you to take them.  Taking over-the-counter medicines, vitamins, herbs, and supplements. General instructions  Your health care provider may ask you to avoid: ? Beverages that have caffeine, such as coffee and tea. ? Any products that contain nicotine  or tobacco. These products include cigarettes, e-cigarettes, and chewing tobacco. If you need help quitting, ask your health care provider.  Do not use lotions or creams on the same day that you will be having the procedure. What happens during the procedure? For EMG   Your health care provider will ask you to stay in a position so that he or she can access the muscle that will be studied. You may be standing, sitting, or lying down.  You may be given a medicine that numbs the area (local anesthetic).  A very thin needle that has an electrode will be inserted into your muscle.  Another small electrode will be placed on your skin near the muscle.  Your health care provider will ask you to continue to remain still.  The electrodes will send a signal that tells about the electrical activity of your muscles. You may see this on a monitor or hear it in the room.  After your muscles have been studied at rest, your health care provider will ask you to contract or flex your muscles. The electrodes will send a signal that tells about the electrical activity of your muscles.  Your health care provider will remove the electrodes and the electrode needles when the procedure is finished. The procedure may vary among health care providers and hospitals. For NCS   An electrode that records your nerve activity (recording electrode) will be placed on your skin by the muscle that is being studied.  An electrode that is used as a reference (reference electrode) will be placed near the recording electrode.  A paste or gel will be applied to your skin between the recording electrode and the reference electrode.  Your nerve will be stimulated with a mild shock. Your health care provider will measure how much time it takes for your muscle to react.  Your health care provider will remove the electrodes and the gel when the procedure is finished. The procedure may vary among health care providers and  hospitals. What happens after the procedure?  It is up to you to get the results of your procedure. Ask your health care provider, or the department that is doing the procedure, when your results will be ready.  Your health care provider may: ? Give you medicines for any pain. ? Monitor the insertion sites to make sure that bleeding stops. Summary  Electromyoneurogram is a test to check how well your muscles and nerves are working.  If the results of the tests are abnormal, this may indicate disease or injury.  This is a  safe procedure. However, problems may occur, such as bleeding and infection.  Your health care provider will do two tests to complete this procedure. One checks your muscles (EMG) and another checks your nerves (NCS).  It is up to you to get the results of your procedure. Ask your health care provider, or the department that is doing the procedure, when your results will be ready. This information is not intended to replace advice given to you by your health care provider. Make sure you discuss any questions you have with your health care provider. Document Revised: 05/30/2018 Document Reviewed: 05/12/2018 Elsevier Patient Education  [place].

## 2020-09-16 NOTE — Addendum Note (Signed)
Addended by: Sarina Ill B on: 09/16/2020 08:00 AM   Modules accepted: Orders

## 2020-09-16 NOTE — Progress Notes (Addendum)
[facility]    Provider:  Dr Jaynee Eagles Requesting Provider: Janith Lima, MD Primary Care Provider:  Janith Lima, MD  CC:  numbness  HPI:  Cory Joyce is a 64 y.o. male here as requested by Janith Lima, MD for numbness. I reviewed Dr. Ronnald Ramp' notes: Patient has been complaining of a 106-monthhistory of numbness isolated to his left thumb, no weakness or incoordination, he does repetitive activities but does not experience any wrist or hand pain, he has been seeing a chiropractor for low back pain and has done e-stim for the upper extremity which has not helped, he does not experience neck pain.  Patient was sent over for evaluation of this and for possible EMG nerve conduction study.  He woke up with numbness 6 months ago. In the left thumb and forfinger. No pain in the neck or shooting pain or weakness. Chiropractor has not helped. No events the evening it occurred. It is not worse in the middle of the night. The right is not affected. He is left handed. No inciting events, was as usual, he sleeps very deeply, he sits and types all day. Continuous. Nothing makes it worse or better. No other associated symptoms. Numbness on the bottom of the feet, he noticed it a few weeks after the hand, symmetrical, all the way across the bottom and the toes. Like they are waking up. No weakness. No significant progression. No family history of dementia, neuropathy. No other focal neurologic deficits, associated symptoms, inciting events or modifiable factors.  Reviewed notes, labs and imaging from outside physicians, which showed:  B12 243, low side of normal will check an MMA. HgbA1c 5.8, total CK normal.   Review of Systems: Patient complains of symptoms per HPI as well as the following symptoms: numbness and tingling. Pertinent negatives and positives per HPI. All others negative.   Social History   Socioeconomic History  . Marital status: Married    Spouse name:  Not on file  . Number of children: Not on file  . Years of education: Not on file  . Highest education level: Not on file  Occupational History  . 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 Smoking status: Never Smoker  . Smokeless tobacco: Never Used  Substance and Sexual Activity  . Alcohol use: Yes    Alcohol/week: 14.0 standard drinks    Types: 14 Cans of beer per week    Comment: several a week   . Drug use: No  . Sexual activity: Yes  Other Topics Concern  . Not on file  Social History Narrative   Regular exercise-yes   Social Determinants of Health   Financial Resource Strain: Not on file  Food Insecurity: Not on file  Transportation Needs: Not on file  Physical Activity: Not on file  Stress: Not on file  Social Connections: Not on file  Intimate Partner Violence: Not on file    Family History  Problem Relation Age of Onset  . Prostate cancer Father   . Alcohol abuse Father   . Colon polyps Father   . Cancer Neg Hx   . Diabetes Neg Hx   . Drug abuse Neg Hx   . Early death Neg Hx   . Heart disease Neg Hx   . Hyperlipidemia Neg Hx   . Hypertension Neg Hx   . Kidney disease Neg Hx   . Stroke Neg Hx   . Colon cancer Neg Hx   .  Esophageal cancer Neg Hx   . Rectal cancer Neg Hx   . Stomach cancer Neg Hx     Past Medical History:  Diagnosis Date  . ADHD (attention deficit hyperactivity disorder)   . BCC (basal cell carcinoma), arm    left forearm 10 years ago  . Hyperlipidemia    borderline but no treatment  . Personal history of colonic polyps 09/22/2010  . Right clavicle fracture Jan 2011  . Sleep apnea    tried but unsuccesssful for c pap    Patient Active Problem List   Diagnosis Date Noted  . Numbness of left hand 08/05/2020  . Paronychia of third toe, left 08/05/2020  . Bilateral high frequency sensorineural hearing loss 10/03/2017  . OSA (obstructive sleep apnea) 12/29/2016  . Attention deficit hyperactivity disorder (ADHD)  12/29/2016  . Hypersomnia with sleep apnea 06/02/2016  . Hyperglycemia 05/11/2016  . BCC (basal cell carcinoma of skin) 12/01/2015  . Hyperlipidemia with target LDL less than 130 12/01/2015  . Routine general medical examination at a health care facility 07/01/2014  . Personal history of colonic polyps 09/22/2010    Past Surgical History:  Procedure Laterality Date  . COLONOSCOPY  09-22-2010    Current Outpatient Medications  Medication Sig Dispense Refill  . aspirin 81 MG tablet Take 81 mg by mouth daily.    Marland Kitchen atorvastatin (LIPITOR) 40 MG tablet Take 1 tablet (40 mg total) by mouth daily. 90 tablet 0  . methylphenidate (RITALIN) 10 MG tablet Take 10 mg by mouth every evening.    . Methylphenidate 25.9 MG TBED Take 2 tablets by mouth daily with breakfast.     . vitamin B-12 (CYANOCOBALAMIN) 1000 MCG tablet Take 1 tablet (1,000 mcg total) by mouth daily. 30 tablet 6   No current facility-administered medications for this visit.    Allergies as of 09/16/2020 - Review Complete 09/16/2020  Allergen Reaction Noted  . Thimerosal Itching 08/15/2015    Vitals: BP (!) 149/85   Pulse 86   Ht '5\' 10"'  (1.778 m)   Wt 191 lb (86.6 kg)   BMI 27.41 kg/m  Last Weight:  Wt Readings from Last 1 Encounters:  09/16/20 191 lb (86.6 kg)   Last Height:   Ht Readings from Last 1 Encounters:  09/16/20 '5\' 10"'  (1.778 m)     Physical exam: Exam: Gen: NAD, conversant, well nourised, well groomed                     CV: RRR, no MRG. No Carotid Bruits. No peripheral edema, warm, nontender Eyes: Conjunctivae clear without exudates or hemorrhage  Neuro: Detailed Neurologic Exam  Speech:    Speech is normal; fluent and spontaneous with normal comprehension.  Cognition:    The patient is oriented to person, place, and time;     recent and remote memory intact;     language fluent;     normal attention, concentration,     fund of knowledge Cranial Nerves:    The pupils are equal, round,  and reactive to light. Pupils too small to ap[preciate fundi.  Visual fields are full to finger confrontation. Extraocular movements are intact. Trigeminal sensation is intact and the muscles of mastication are normal. The face is symmetric. The palate elevates in the midline. Hearing intact. Voice is normal. Shoulder shrug is normal. The tongue has normal motion without fasciculations.   Coordination:    No dysmetria or ataxia  Gait:    Normal native gait  Motor Observation:    No asymmetry, no atrophy, and no involuntary movements noted. Tone:    Normal muscle tone.    Posture:    Posture is normal. normal erect    Strength:    Strength is V/V in the upper and lower limbs.      Sensation: intact to LT, pin prick, vibration distally     Reflex Exam:  DTR's:    Deep tendon reflexes in the upper and lower extremities are normal bilaterally.   Toes:    The toes are downgoing bilaterally.   Clonus:    Clonus is absent.    Assessment/Plan:  64 year old with numbness left thumb and index finger (CTS?) also numbness on the bottom of his feet (B12 deficiency?)  - EMG/NCS bilateral uppers - B12 deficiency? B12 is 243, although technically normal it is very low and he could be deficient. Will check MMA and ask him to take daily B12 and recheck in 4-8 weeks. Will also order several other labs.   Orders Placed This Encounter  Procedures  . B12 and Folate Panel  . Methylmalonic acid, serum  . Vitamin B1  . Vitamin B6  . Heavy metals, blood  . Multiple Myeloma Panel (SPEP&IFE w/QIG)  . NCV with EMG(electromyography)   Meds ordered this encounter  Medications  . vitamin B-12 (CYANOCOBALAMIN) 1000 MCG tablet    Sig: Take 1 tablet (1,000 mcg total) by mouth daily.    Dispense:  30 tablet    Refill:  6    Cc: Janith Lima, MD,  Janith Lima, MD  Sarina Ill, MD  [facility] [address] [place], [place] [address]  Phone  [phone] Fax [phone]

## 2020-10-01 LAB — HEAVY METALS, BLOOD
Arsenic: 1 ug/L — ABNORMAL LOW (ref 2–23)
Lead, Blood: 1 ug/dL (ref 0–4)
Mercury: <1 ug/L (ref 0.0–14.9)

## 2020-10-01 LAB — B12 AND FOLATE PANEL
Folate: 3.4 ng/mL (ref >3.0)
Vitamin B-12: 312 pg/mL (ref 232–1245)

## 2020-10-01 LAB — MULTIPLE MYELOMA PANEL, SERUM
Albumin SerPl Elph-Mcnc: 4 g/dL (ref 2.9–4.4)
Albumin/Glob SerPl: 1.7 (ref 0.7–1.7)
Alpha 1: 0.2 g/dL (ref 0.0–0.4)
Alpha2 Glob SerPl Elph-Mcnc: 0.5 g/dL (ref 0.4–1.0)
B-Globulin SerPl Elph-Mcnc: 0.9 g/dL (ref 0.7–1.3)
Gamma Glob SerPl Elph-Mcnc: 0.9 g/dL (ref 0.4–1.8)
Globulin, Total: 2.5 g/dL (ref 2.2–3.9)
IgA/Immunoglobulin A, Serum: 48 mg/dL — ABNORMAL LOW (ref 61–437)
IgG (Immunoglobin G), Serum: 889 mg/dL (ref 603–1613)
IgM (Immunoglobulin M), Srm: 58 mg/dL (ref 20–172)
Total Protein: 6.5 g/dL (ref 6.0–8.5)

## 2020-10-01 LAB — VITAMIN B6: Vitamin B6: 3.6 ug/L — ABNORMAL LOW (ref 5.3–46.7)

## 2020-10-01 LAB — VITAMIN B1: Thiamine: 169.6 nmol/L (ref 66.5–200.0)

## 2020-10-01 LAB — METHYLMALONIC ACID, SERUM: Methylmalonic Acid: 236 nmol/L (ref 0–378)

## 2020-10-05 NOTE — Progress Notes (Unsigned)
Full Name: Cory Joyce Gender: Male MRN #: [id number] Date of Birth: Feb 10, 1956    Visit Date: 10/06/2020 12:23 Age: 64 Years Examining Physician: Sarina Ill, MD  Requesting Provider: Janith Lima, MD Primary Care Provider:  Janith Lima, MD    History:  Numbness in the left thumb and forefinger. His right thumb and forefinger much less affected. However today he reports his whole left hand is tingling. B12 is low normal at 312 and MMA normal, B6 was very low at 3.6 (5.3-46.7), otherwise heavy metals, multiple myeloma panel IFE/SPEP, vitamin B1, folate were normal. I do not think this is the cause of his symptoms but I do recommend supplementation. For further evaluation I recommend MRI brain and cervical spine and will order these(rule out MS, stroke,cervical radic or myelopathy or other lesion). Waxing and waning quality, needs eval for demyelinating lesion.. Also supplement b12 and B6, a general multivitamin may be sufficient.  Summary: NCS performed on the bilateral upper extremities. EMG on the left upper extremity. All nerves and muscles (as indicated in the following tables) were within normal limits.    Conclusion: This is a normal study. No etiology found for sensory changes in the hands. Recommend imaging as per above.   ------------------------------- Sarina Ill, M.D.  [facility] [place], [place] [address] Tel: [phone] Fax: [phone]  Verbal informed consent was obtained from the patient, patient was informed of potential risk of procedure, including bruising, bleeding, hematoma formation, infection, muscle weakness, muscle pain, numbness, among others.         [place]    Nerve / Sites Muscle Latency Ref. Amplitude Ref. Rel Amp Segments Distance Velocity Ref. Area    ms ms mV mV %  cm m/s m/s mVms  L Median - APB     Wrist APB 3.4 ?4.4 12.7 ?4.0 100 Wrist - APB 7   41.2     Upper arm APB 7.5  12.6  99.3 Upper  arm - Wrist 23 57 ?49 40.6  R Median - APB     Wrist APB 3.5 ?4.4 10.3 ?4.0 100 Wrist - APB 7   35.7     Upper arm APB 7.8  9.8  95.6 Upper arm - Wrist 22 52 ?49 28.5  L Ulnar - ADM     Wrist ADM 3.3 ?3.3 10.6 ?6.0 100 Wrist - ADM 7   31.6     B.Elbow ADM 6.7  9.3  88.4 B.Elbow - Wrist 21 62 ?49 29.5     A.Elbow ADM 8.4  9.5  101 A.Elbow - B.Elbow 10 59 ?49 30.3         A.Elbow - Wrist               SNC    Nerve / Sites Rec. Site Peak Lat Ref.  Amp Ref. Segments Distance Peak Diff Ref.    ms ms V V  cm ms ms  L Radial - Anatomical snuff box (Forearm)     Forearm Wrist 2.6 ?2.9 33 ?15 Forearm - Wrist 10    L Median, Ulnar - Transcarpal comparison     Median Palm Wrist 1.9 ?2.2 105 ?35 Median Palm - Wrist 8       Ulnar Palm Wrist 2.2 ?2.2 18 ?12 Ulnar Palm - Wrist 8          Median Palm - Ulnar Palm  -0.2 ?0.4  R Median, Ulnar - Transcarpal comparison  Median Palm Wrist 2.1 ?2.2 59 ?35 Median Palm - Wrist 8       Ulnar Palm Wrist 2.1 ?2.2 37 ?12 Ulnar Palm - Wrist 8          Median Palm - Ulnar Palm  -0.0 ?0.4  L Median - Orthodromic (Dig II, Mid palm)     Dig II Wrist 3.0 ?3.4 18 ?10 Dig II - Wrist 13    R Median - Orthodromic (Dig II, Mid palm)     Dig II Wrist 3.0 ?3.4 12 ?10 Dig II - Wrist 13    L Ulnar - Orthodromic, (Dig V, Mid palm)     Dig V Wrist 3.0 ?3.1 9 ?5 Dig V - Wrist 11    R Ulnar - Orthodromic, (Dig V, Mid palm)     Dig V Wrist 2.8 ?3.1 9 ?5 Dig V - Wrist 11                     F  Wave    Nerve F Lat Ref.   ms ms  L Ulnar - ADM 31.0 ?32.0       EMG Summary Table    Spontaneous MUAP Recruitment  Muscle IA Fib PSW Fasc Other Amp Dur. Poly Pattern  L. Deltoid Normal None None None _______ Normal Normal Normal Normal  L. Triceps brachii Normal None None None _______ Normal Normal Normal Normal  L. Pronator teres Normal None None None _______ Normal Normal Normal Normal  L. First dorsal interosseous Normal None None None _______ Normal Normal Normal Normal   L. Opponens pollicis Normal None None None _______ Normal Normal Normal Normal  L. Cervical paraspinals (low) Normal None None None _______ Normal Normal Normal Normal      

## 2020-10-06 ENCOUNTER — Other Ambulatory Visit: Payer: Self-pay | Admitting: Internal Medicine

## 2020-10-06 ENCOUNTER — Encounter: Payer: Self-pay | Admitting: Internal Medicine

## 2020-10-06 ENCOUNTER — Ambulatory Visit (INDEPENDENT_AMBULATORY_CARE_PROVIDER_SITE_OTHER): Payer: BC Managed Care – PPO | Admitting: Neurology

## 2020-10-06 DIAGNOSIS — M5412 Radiculopathy, cervical region: Secondary | ICD-10-CM

## 2020-10-06 DIAGNOSIS — R202 Paresthesia of skin: Secondary | ICD-10-CM

## 2020-10-06 DIAGNOSIS — R200 Anesthesia of skin: Secondary | ICD-10-CM | POA: Diagnosis not present

## 2020-10-06 DIAGNOSIS — G8929 Other chronic pain: Secondary | ICD-10-CM

## 2020-10-06 DIAGNOSIS — R29898 Other symptoms and signs involving the musculoskeletal system: Secondary | ICD-10-CM

## 2020-10-06 DIAGNOSIS — Z86010 Personal history of colonic polyps: Secondary | ICD-10-CM

## 2020-10-06 DIAGNOSIS — Z0289 Encounter for other administrative examinations: Secondary | ICD-10-CM

## 2020-10-06 NOTE — Procedures (Signed)
Full Name: Cory Joyce Gender: Male MRN #: [id number] Date of Birth: 1956/07/01    Visit Date: 10/06/2020 12:23 Age: 65 Years Examining Physician: Sarina Ill, MD  Requesting Provider: Janith Lima, MD Primary Care Provider:  Janith Lima, MD    History:  Numbness in the left thumb and forefinger. His right thumb and forefinger much less affected. However today he reports his whole left hand is tingling. B12 is low normal at 312 and MMA normal, B6 was very low at 3.6 (5.3-46.7), otherwise heavy metals, multiple myeloma panel IFE/SPEP, vitamin B1, folate were normal. I do not think this is the cause of his symptoms but I do recommend supplementation. For further evaluation I recommend MRI brain and cervical spine and will order these(rule out MS, stroke,cervical radic or myelopathy or other lesion). Waxing and waning quality, needs eval for demyelinating lesion.. Also supplement b12 and B6, a general multivitamin may be sufficient.  Summary: NCS performed on the bilateral upper extremities. EMG on the left upper extremity. All nerves and muscles (as indicated in the following tables) were within normal limits.    Conclusion: This is a normal study. No etiology found for sensory changes in the hands. Recommend imaging as per above.   ------------------------------- Sarina Ill, M.D.  [facility] [place], [place] [address] Tel: [phone] Fax: [phone]  Verbal informed consent was obtained from the patient, patient was informed of potential risk of procedure, including bruising, bleeding, hematoma formation, infection, muscle weakness, muscle pain, numbness, among others.         [place]    Nerve / Sites Muscle Latency Ref. Amplitude Ref. Rel Amp Segments Distance Velocity Ref. Area    ms ms mV mV %  cm m/s m/s mVms  L Median - APB     Wrist APB 3.4 ?4.4 12.7 ?4.0 100 Wrist - APB 7   41.2     Upper arm APB 7.5  12.6  99.3 Upper  arm - Wrist 23 57 ?49 40.6  R Median - APB     Wrist APB 3.5 ?4.4 10.3 ?4.0 100 Wrist - APB 7   35.7     Upper arm APB 7.8  9.8  95.6 Upper arm - Wrist 22 52 ?49 28.5  L Ulnar - ADM     Wrist ADM 3.3 ?3.3 10.6 ?6.0 100 Wrist - ADM 7   31.6     B.Elbow ADM 6.7  9.3  88.4 B.Elbow - Wrist 21 62 ?49 29.5     A.Elbow ADM 8.4  9.5  101 A.Elbow - B.Elbow 10 59 ?49 30.3         A.Elbow - Wrist               SNC    Nerve / Sites Rec. Site Peak Lat Ref.  Amp Ref. Segments Distance Peak Diff Ref.    ms ms V V  cm ms ms  L Radial - Anatomical snuff box (Forearm)     Forearm Wrist 2.6 ?2.9 33 ?15 Forearm - Wrist 10    L Median, Ulnar - Transcarpal comparison     Median Palm Wrist 1.9 ?2.2 105 ?35 Median Palm - Wrist 8       Ulnar Palm Wrist 2.2 ?2.2 18 ?12 Ulnar Palm - Wrist 8          Median Palm - Ulnar Palm  -0.2 ?0.4  R Median, Ulnar - Transcarpal comparison  Median Palm Wrist 2.1 ?2.2 59 ?35 Median Palm - Wrist 8       Ulnar Palm Wrist 2.1 ?2.2 37 ?12 Ulnar Palm - Wrist 8          Median Palm - Ulnar Palm  -0.0 ?0.4  L Median - Orthodromic (Dig II, Mid palm)     Dig II Wrist 3.0 ?3.4 18 ?10 Dig II - Wrist 13    R Median - Orthodromic (Dig II, Mid palm)     Dig II Wrist 3.0 ?3.4 12 ?10 Dig II - Wrist 13    L Ulnar - Orthodromic, (Dig V, Mid palm)     Dig V Wrist 3.0 ?3.1 9 ?5 Dig V - Wrist 11    R Ulnar - Orthodromic, (Dig V, Mid palm)     Dig V Wrist 2.8 ?3.1 9 ?5 Dig V - Wrist 11                     F  Wave    Nerve F Lat Ref.   ms ms  L Ulnar - ADM 31.0 ?32.0       EMG Summary Table    Spontaneous MUAP Recruitment  Muscle IA Fib PSW Fasc Other Amp Dur. Poly Pattern  L. Deltoid Normal None None None _______ Normal Normal Normal Normal  L. Triceps brachii Normal None None None _______ Normal Normal Normal Normal  L. Pronator teres Normal None None None _______ Normal Normal Normal Normal  L. First dorsal interosseous Normal None None None _______ Normal Normal Normal Normal   L. Opponens pollicis Normal None None None _______ Normal Normal Normal Normal  L. Cervical paraspinals (low) Normal None None None _______ Normal Normal Normal Normal      

## 2020-10-06 NOTE — Progress Notes (Signed)
See procedure note.

## 2020-10-09 ENCOUNTER — Telehealth: Payer: Self-pay | Admitting: Neurology

## 2020-10-09 NOTE — Telephone Encounter (Signed)
no to the covid questions MR Brain w/wo contrast & MR Cervical spine w/wo contrast Dr. Ihor Dow [facility]: [phone] (exp. 10/08/20 to 11/06/20). Patient is scheduled at [facility] [address] for 10/15/20.

## 2020-10-15 ENCOUNTER — Ambulatory Visit: Payer: BC Managed Care – PPO

## 2020-10-15 DIAGNOSIS — R202 Paresthesia of skin: Secondary | ICD-10-CM

## 2020-10-15 DIAGNOSIS — R29898 Other symptoms and signs involving the musculoskeletal system: Secondary | ICD-10-CM | POA: Diagnosis not present

## 2020-10-15 DIAGNOSIS — R200 Anesthesia of skin: Secondary | ICD-10-CM

## 2020-10-15 DIAGNOSIS — M542 Cervicalgia: Secondary | ICD-10-CM

## 2020-10-15 DIAGNOSIS — G8929 Other chronic pain: Secondary | ICD-10-CM

## 2020-10-15 MED ORDER — GADOBENATE DIMEGLUMINE 529 MG/ML IV SOLN
20.0000 mL | Freq: Once | INTRAVENOUS | Status: AC | PRN
  Administered 2020-10-15: 20 mL via INTRAVENOUS

## 2020-10-29 ENCOUNTER — Other Ambulatory Visit: Payer: Self-pay | Admitting: Internal Medicine

## 2020-10-29 DIAGNOSIS — E785 Hyperlipidemia, unspecified: Secondary | ICD-10-CM

## 2020-11-27 ENCOUNTER — Ambulatory Visit (AMBULATORY_SURGERY_CENTER): Payer: BC Managed Care – PPO | Admitting: *Deleted

## 2020-11-27 ENCOUNTER — Other Ambulatory Visit: Payer: Self-pay

## 2020-11-27 VITALS — Ht 70.0 in | Wt 190.0 lb

## 2020-11-27 DIAGNOSIS — Z86010 Personal history of colonic polyps: Secondary | ICD-10-CM

## 2020-11-27 NOTE — Progress Notes (Signed)
Pt verified name, DOB, address and insurance during PV today. Pt mailed instruction packet to included paper to complete and mail back to [facility] with addressed and stamped envelope, Emmi video, copy of consent form to read and not return, and instructions.  PV completed over the phone. Pt encouraged to call with questions or issues   No egg or soy allergy known to patient  No issues with past sedation with any surgeries or procedures No past  intubation   No FH of Malignant Hyperthermia No diet pills per patient No home 02 use per patient  No blood thinners per patient  Pt denies issues with constipation  No A fib or A flutter  EMMI video to pt or via [place] 19 guidelines implemented in PV today with Pt and RN  Pt is fully vaccinated  for Covid    Due to the COVID-19 pandemic we are asking patients to follow certain guidelines.  Pt aware of COVID protocols and [facility] guidelines

## 2020-12-02 ENCOUNTER — Encounter: Payer: Self-pay | Admitting: Internal Medicine

## 2020-12-16 ENCOUNTER — Encounter: Payer: Self-pay | Admitting: Internal Medicine

## 2020-12-16 ENCOUNTER — Ambulatory Visit (AMBULATORY_SURGERY_CENTER): Payer: BC Managed Care – PPO | Admitting: Internal Medicine

## 2020-12-16 ENCOUNTER — Other Ambulatory Visit: Payer: Self-pay

## 2020-12-16 VITALS — BP 96/56 | HR 62 | Temp 96.8°F | Resp 12 | Ht 70.0 in | Wt 190.0 lb

## 2020-12-16 DIAGNOSIS — D128 Benign neoplasm of rectum: Secondary | ICD-10-CM

## 2020-12-16 DIAGNOSIS — Z86010 Personal history of colonic polyps: Secondary | ICD-10-CM | POA: Diagnosis present

## 2020-12-16 MED ORDER — SODIUM CHLORIDE 0.9 % IV SOLN: 500.0000 mL | Freq: Once | INTRAVENOUS | Status: DC

## 2020-12-16 NOTE — Patient Instructions (Addendum)
I found and removed one tiny rectal polyp.  You have diverticulosis - thickened muscle rings and pouches in the colon wall. Please read the handout about this condition.  I will let you know pathology results and when to have another routine colonoscopy by mail and/or My Chart.  I appreciate the opportunity to care for you. Gatha Mayer, MD, [facility]  Handout given  : polyps, and diverticulosis Resume previous diet Continue current medications  YOU HAD AN ENDOSCOPIC PROCEDURE TODAY AT [place]:   Refer to the procedure report that was given to you for any specific questions about what was found during the examination.  If the procedure report does not answer your questions, please call your gastroenterologist to clarify.  If you requested that your care partner not be given the details of your procedure findings, then the procedure report has been included in a sealed envelope for you to review at your convenience later.  YOU SHOULD EXPECT: Some feelings of bloating in the abdomen. Passage of more gas than usual.  Walking can help get rid of the air that was put into your GI tract during the procedure and reduce the bloating. If you had a lower endoscopy (such as a colonoscopy or flexible sigmoidoscopy) you may notice spotting of blood in your stool or on the toilet paper. If you underwent a bowel prep for your procedure, you may not have a normal bowel movement for a few days.  Please Note:  You might notice some irritation and congestion in your nose or some drainage.  This is from the oxygen used during your procedure.  There is no need for concern and it should clear up in a day or so.  SYMPTOMS TO REPORT IMMEDIATELY:   Following lower endoscopy (colonoscopy or flexible sigmoidoscopy):  Excessive amounts of blood in the stool  Significant tenderness or worsening of abdominal pains  Swelling of the abdomen that is new, acute  Fever of 100F or higher  For urgent or  emergent issues, a gastroenterologist can be reached at any hour by calling [phone]. Do not use MyChart messaging for urgent concerns.   DIET:  We do recommend a small meal at first, but then you may proceed to your regular diet.  Drink plenty of fluids but you should avoid alcoholic beverages for 24 hours.  ACTIVITY:  You should plan to take it easy for the rest of today and you should NOT DRIVE or use heavy machinery until tomorrow (because of the sedation medicines used during the test).    FOLLOW UP: Our staff will call the number listed on your records 48-72 hours following your procedure to check on you and address any questions or concerns that you may have regarding the information given to you following your procedure. If we do not reach you, we will leave a message.  We will attempt to reach you two times.  During this call, we will ask if you have developed any symptoms of COVID 19. If you develop any symptoms (ie: fever, flu-like symptoms, shortness of breath, cough etc.) before then, please call [phone].  If you test positive for Covid 19 in the 2 weeks post procedure, please call and report this information to [place].    If any biopsies were taken you will be contacted by phone or by letter within the next 1-3 weeks.  Please call us at [phone] if you have not heard about the biopsies in 3 weeks.  SIGNATURES/CONFIDENTIALITY: You and/or your care partner have signed paperwork which will be entered into your electronic medical record.  These signatures attest to the fact that that the information above on your After Visit Summary has been reviewed and is understood.  Full responsibility of the confidentiality of this discharge information lies with you and/or your care-partner.

## 2020-12-16 NOTE — Progress Notes (Signed)
Pt's states no medical or surgical changes since previsit or office visit. 

## 2020-12-16 NOTE — Progress Notes (Signed)
C.W. vital signs. 

## 2020-12-16 NOTE — Progress Notes (Signed)
Called to room to assist during endoscopic procedure.  Patient ID and intended procedure confirmed with present staff. Received instructions for my participation in the procedure from the performing physician.  

## 2020-12-16 NOTE — Progress Notes (Signed)
A and O x3. Report to RN. Tolerated MAC anesthesia well.

## 2020-12-16 NOTE — Op Note (Signed)
[place] Patient Name: Cory Joyce Procedure Date: 12/16/2020 1:37 PM MRN: [id number] Endoscopist: Gatha Mayer , MD Age: 65 Referring MD:  Date of Birth: 12/28/1955 Gender: Male Account #: [id number] Procedure:                Colonoscopy Indications:              Surveillance: Personal history of adenomatous                            polyps on last colonoscopy > 5 years ago Medicines:                Propofol per Anesthesia Procedure:                Pre-Anesthesia Assessment:                           - Prior to the procedure, a History and Physical                            was performed, and patient medications and                            allergies were reviewed. The patient's tolerance of                            previous anesthesia was also reviewed. The risks                            and benefits of the procedure and the sedation                            options and risks were discussed with the patient.                            All questions were answered, and informed consent                            was obtained. Prior Anticoagulants: The patient has                            taken no previous anticoagulant or antiplatelet                            agents. ASA Grade Assessment: II - A patient with                            mild systemic disease. After reviewing the risks                            and benefits, the patient was deemed in                            satisfactory condition to undergo the procedure.  After obtaining informed consent, the colonoscope                            was passed under direct vision. Throughout the                            procedure, the patient's blood pressure, pulse, and                            oxygen saturations were monitored continuously. The                            Olympus CF-HQ190L ([id number]) Colonoscope was                            introduced through the  anus and advanced to the the                            cecum, identified by appendiceal orifice and                            ileocecal valve. The colonoscopy was performed                            without difficulty. The patient tolerated the                            procedure well. The quality of the bowel                            preparation was adequate. The ileocecal valve,                            appendiceal orifice, and rectum were photographed.                            The bowel preparation used was Miralax via split                            dose instruction. Scope In: 1:41:02 PM Scope Out: 2:06:22 PM Scope Withdrawal Time: 0 hours 18 minutes 36 seconds  Total Procedure Duration: 0 hours 25 minutes 20 seconds  Findings:                 The perianal and digital rectal examinations were                            normal. Pertinent negatives include normal prostate                            (size, shape, and consistency).                           A 3 mm polyp was found in the rectum. The polyp was  sessile. The polyp was removed with a cold snare.                            Resection and retrieval were complete. Verification                            of patient identification for the specimen was                            done. Estimated blood loss was minimal.                           Multiple diverticula were found in the sigmoid                            colon.                           The exam was otherwise without abnormality on                            direct and retroflexion views. Complications:            No immediate complications. Estimated Blood Loss:     Estimated blood loss was minimal. Impression:               - One 3 mm polyp in the rectum, removed with a cold                            snare. Resected and retrieved.                           - Moderate diverticulosis in the sigmoid colon.                            - The examination was otherwise normal on direct                            and retroflexion views. Recommendation:           - Patient has a contact number available for                            emergencies. The signs and symptoms of potential                            delayed complications were discussed with the                            patient. Return to normal activities tomorrow.                            Written discharge instructions were provided to the                            patient.                           -  Continue present medications.                           - Repeat colonoscopy is recommended for                            surveillance. Likely 5 years The colonoscopy date                            will be determined after pathology results from                            today's exam become available for review. Better                            fiber restriction and different prep. Gatha Mayer, MD 12/16/2020 2:20:03 PM This report has been signed electronically.

## 2020-12-18 ENCOUNTER — Telehealth: Payer: Self-pay

## 2020-12-18 NOTE — Telephone Encounter (Signed)
  Follow up Call-  Call back number 12/16/2020  Post procedure Call Back phone  # [phone]  Permission to leave phone message Yes  Some recent data might be hidden     Patient questions:  Do you have a fever, pain , or abdominal swelling? No. Pain Score  0 *  Have you tolerated food without any problems? Yes.    Have you been able to return to your normal activities? Yes.    Do you have any questions about your discharge instructions: Diet   No. Medications  No. Follow up visit  No.  Do you have questions or concerns about your Care? No.  Actions: * If pain score is 4 or above: No action needed, pain <4. 1. Have you developed a fever since your procedure? no  2.   Have you had an respiratory symptoms (SOB or cough) since your procedure? no  3.   Have you tested positive for COVID 19 since your procedure no  4.   Have you had any family members/close contacts diagnosed with the COVID 19 since your procedure?  no   If yes to any of these questions please route to Joylene John, RN and Joella Prince, RN

## 2020-12-29 ENCOUNTER — Encounter: Payer: Self-pay | Admitting: Internal Medicine

## 2021-04-24 ENCOUNTER — Ambulatory Visit (INDEPENDENT_AMBULATORY_CARE_PROVIDER_SITE_OTHER): Payer: BC Managed Care – PPO | Admitting: Internal Medicine

## 2021-04-24 ENCOUNTER — Other Ambulatory Visit: Payer: Self-pay

## 2021-04-24 ENCOUNTER — Encounter: Payer: Self-pay | Admitting: Internal Medicine

## 2021-04-24 VITALS — BP 142/82 | HR 77 | Wt 193.6 lb

## 2021-04-24 DIAGNOSIS — F909 Attention-deficit hyperactivity disorder, unspecified type: Secondary | ICD-10-CM | POA: Diagnosis not present

## 2021-04-24 DIAGNOSIS — G4733 Obstructive sleep apnea (adult) (pediatric): Secondary | ICD-10-CM

## 2021-04-24 NOTE — Patient Instructions (Addendum)
Order- DME [facility] [employer]- please replace old CPAP machine, change to auto 5-15, mask of choice, humidifier, supplies, AirView/ card  Order- referral to Dr Oneal Grout, DDS orthodontist   consider oral appliance for OSA  Please call if we can help

## 2021-04-24 NOTE — Assessment & Plan Note (Signed)
Benefits from CPAP. Discussed alternatives. Discussed replacement of old CPAP with change to autopap. Plan- replace old CPAP, changing to auto 5-15. Refer to Dr Ron Parker to consider oral appliance.

## 2021-04-24 NOTE — Assessment & Plan Note (Signed)
Treated with methylphenidate which doesn't seem to be interfering with sleep. He reports able to fall asleep quickly.

## 2021-04-24 NOTE — Progress Notes (Signed)
04/24/21- 65 yoM never smoker for sleep evaluation self referred for OSA. Former patient of Dr DeDios HST 19/29/17- AHI 15/ hr, desaturation to 77%, body weight 188 lbs  CPAP 9 Medical problem list includes Hearing loss, ADHD Meds include- Ritalin 10 mg 1 each evening, methylphenidate 25.9 mg 2 daily Epworth score Body weight today- 193 lbs Download- compliance 100%, AHI 2.2/ hr Covid vax- 4 Phizer  (4th was 01/25/21) Sleeps better and feels better using CPAP. Machine is 64 years old and we discussed replacement.  We also discussed alternatives CPAP and he wanted to learn more about oral appliance option.  Prior to Admission medications   Medication Sig Start Date End Date Taking? Authorizing Provider  aspirin 81 MG tablet Take 81 mg by mouth daily.   Yes [provider]  atorvastatin (LIPITOR) 40 MG tablet TAKE 1 TABLET BY MOUTH EVERY DAY 10/29/20  Yes Janith Lima, MD  B Complex Vitamins (B COMPLEX PO) Take by mouth.   Yes [provider]  Cholecalciferol (VITAMIN D3 PO) Take by mouth.   Yes [provider]  methylphenidate (RITALIN) 10 MG tablet Take 10 mg by mouth every evening.   Yes [provider]  Methylphenidate 25.9 MG TBED Take 2 tablets by mouth daily with breakfast.  11/25/16  Yes [provider]  Omega-3 Fatty Acids (FISH OIL PO) Take by mouth.   Yes [provider]  vitamin B-12 (CYANOCOBALAMIN) 1000 MCG tablet Take 1 tablet (1,000 mcg total) by mouth daily. 09/16/20   Melvenia Beam, MD   Past Medical History:  Diagnosis Date   ADHD (attention deficit hyperactivity disorder)    BCC (basal cell carcinoma), arm    left forearm 10 years ago   Hyperlipidemia    borderline but no treatment   Personal history of colonic polyps 09/22/2010   Right clavicle fracture Jan 2011   Sleep apnea    uses c pap   Past Surgical History:  Procedure Laterality Date   COLONOSCOPY  09-22-2010   POLYPECTOMY     Family History   Problem Relation Age of Onset   Prostate cancer Father    Alcohol abuse Father    Colon polyps Father    Cancer Neg Hx    Diabetes Neg Hx    Drug abuse Neg Hx    Early death Neg Hx    Heart disease Neg Hx    Hyperlipidemia Neg Hx    Hypertension Neg Hx    Kidney disease Neg Hx    Stroke Neg Hx    Colon cancer Neg Hx    Esophageal cancer Neg Hx    Rectal cancer Neg Hx    Stomach cancer Neg Hx    Social History   Socioeconomic History   Marital status: Married    Spouse name: Not on file   Number of children: Not on file   Years of education: Not on file   Highest education level: Not on file  Occupational History   Occupation: Gaffer for [employer]  Tobacco Use   Smoking status: Never   Smokeless tobacco: Never  Substance and Sexual Activity   Alcohol use: Yes    Alcohol/week: 14.0 standard drinks    Types: 14 Cans of beer per week    Comment: several a week    Drug use: No   Sexual activity: Yes  Other Topics Concern   Not on file  Social History Narrative   Regular exercise-yes   Social Determinants  of Health   Financial Resource Strain: Not on file  Food Insecurity: Not on file  Transportation Needs: Not on file  Physical Activity: Not on file  Stress: Not on file  Social Connections: Not on file  Intimate Partner Violence: Not on file   ROS-see HPI   + = positive Constitutional:    weight loss, night sweats, fevers, chills, fatigue, lassitude. HEENT:    headaches, difficulty swallowing, tooth/dental problems, sore throat,       sneezing, itching, ear ache, nasal congestion, post nasal drip, snoring, + hearing loss, CV:    chest pain, orthopnea, PND, swelling in lower extremities, anasarca,                                  dizziness, palpitations Resp:   shortness of breath with exertion or at rest.                productive cough,   non-productive cough, coughing up of blood.              change in color of mucus.  wheezing.   Skin:    rash or  lesions. GI:  No-   heartburn, indigestion, abdominal pain, nausea, vomiting, diarrhea,                 change in bowel habits, loss of appetite GU: dysuria, change in color of urine, no urgency or frequency.   flank pain. MS:   joint pain, stiffness, decreased range of motion, back pain. Neuro-     +ADHD- treated Psych:  change in mood or affect.  depression or anxiety.   memory loss.  OBJ- Physical Exam General- Alert, Oriented, Affect-appropriate, Distress- none acute, not obese Skin- rash-none, lesions- none, excoriation- none Lymphadenopathy- none Head- atraumatic            Eyes- Gross vision intact, PERRLA, conjunctivae and secretions clear            Ears- Hearing, canals-normal            Nose- Clear, no-Septal dev, mucus, polyps, erosion, perforation             Throat- Mallampati II , mucosa clear , drainage- none, tonsils- atrophic, + teeth Neck- flexible , trachea midline, no stridor , thyroid nl, carotid no bruit Chest - symmetrical excursion , unlabored           Heart/CV- RRR , no murmur , no gallop  , no rub, nl s1 s2                           - JVD- none , edema- none, stasis changes- none, varices- none           Lung- clear to P&A, wheeze- none, cough- none , dullness-none, rub- none           Chest wall-  Abd-  Br/ Gen/ Rectal- Not done, not indicated Extrem- cyanosis- none, clubbing, none, atrophy- none, strength- nl Neuro- grossly intact to observation

## 2021-04-29 ENCOUNTER — Other Ambulatory Visit: Payer: Self-pay | Admitting: Internal Medicine

## 2021-04-29 DIAGNOSIS — E785 Hyperlipidemia, unspecified: Secondary | ICD-10-CM

## 2021-10-27 ENCOUNTER — Other Ambulatory Visit: Payer: Self-pay | Admitting: Internal Medicine

## 2021-10-27 DIAGNOSIS — E785 Hyperlipidemia, unspecified: Secondary | ICD-10-CM

## 2021-11-11 ENCOUNTER — Other Ambulatory Visit: Payer: Self-pay | Admitting: Internal Medicine

## 2021-11-11 DIAGNOSIS — E785 Hyperlipidemia, unspecified: Secondary | ICD-10-CM

## 2021-12-16 ENCOUNTER — Other Ambulatory Visit: Payer: Self-pay

## 2021-12-16 ENCOUNTER — Ambulatory Visit (INDEPENDENT_AMBULATORY_CARE_PROVIDER_SITE_OTHER): Payer: BC Managed Care – PPO | Admitting: Internal Medicine

## 2021-12-16 ENCOUNTER — Encounter: Payer: Self-pay | Admitting: Internal Medicine

## 2021-12-16 VITALS — BP 128/88 | HR 64 | Temp 98.1°F | Resp 16 | Ht 70.0 in | Wt 195.8 lb

## 2021-12-16 DIAGNOSIS — E785 Hyperlipidemia, unspecified: Secondary | ICD-10-CM

## 2021-12-16 DIAGNOSIS — R0609 Other forms of dyspnea: Secondary | ICD-10-CM

## 2021-12-16 DIAGNOSIS — Z23 Encounter for immunization: Secondary | ICD-10-CM

## 2021-12-16 DIAGNOSIS — Z0000 Encounter for general adult medical examination without abnormal findings: Secondary | ICD-10-CM | POA: Diagnosis not present

## 2021-12-16 LAB — BASIC METABOLIC PANEL
BUN: 16 mg/dL (ref 6–23)
CO2: 29 mEq/L (ref 19–32)
Calcium: 10 mg/dL (ref 8.4–10.5)
Chloride: 103 mEq/L (ref 96–112)
Creatinine, Ser: 1.13 mg/dL (ref 0.40–1.50)
GFR: 68.82 mL/min (ref >60.00)
Glucose, Bld: 107 mg/dL — ABNORMAL HIGH (ref 70–99)
Potassium: 4.4 mEq/L (ref 3.5–5.1)
Sodium: 140 mEq/L (ref 135–145)

## 2021-12-16 LAB — LIPID PANEL
Cholesterol: 130 mg/dL (ref 0–200)
HDL: 49.1 mg/dL (ref >39.00)
LDL Cholesterol: 64 mg/dL (ref 0–99)
NonHDL: 80.92
Total CHOL/HDL Ratio: 3
Triglycerides: 86 mg/dL (ref 0.0–149.0)
VLDL: 17.2 mg/dL (ref 0.0–40.0)

## 2021-12-16 LAB — TSH: TSH: 1.08 u[IU]/mL (ref 0.35–5.50)

## 2021-12-16 LAB — CBC WITH DIFFERENTIAL/PLATELET
Basophils Absolute: 0 10*3/uL (ref 0.0–0.1)
Basophils Relative: 0.6 % (ref 0.0–3.0)
Eosinophils Absolute: 0.1 10*3/uL (ref 0.0–0.7)
Eosinophils Relative: 2.2 % (ref 0.0–5.0)
HCT: 47.7 % (ref 39.0–52.0)
Hemoglobin: 16.5 g/dL (ref 13.0–17.0)
Lymphocytes Relative: 40.6 % (ref 12.0–46.0)
Lymphs Abs: 2.7 10*3/uL (ref 0.7–4.0)
MCHC: 34.5 g/dL (ref 30.0–36.0)
MCV: 85.9 fl (ref 78.0–100.0)
Monocytes Absolute: 0.5 10*3/uL (ref 0.1–1.0)
Monocytes Relative: 7.7 % (ref 3.0–12.0)
Neutro Abs: 3.3 10*3/uL (ref 1.4–7.7)
Neutrophils Relative %: 48.9 % (ref 43.0–77.0)
Platelets: 301 10*3/uL (ref 150.0–400.0)
RBC: 5.56 Mil/uL (ref 4.22–5.81)
RDW: 13.9 % (ref 11.5–15.5)
WBC: 6.7 10*3/uL (ref 4.0–10.5)

## 2021-12-16 LAB — HEPATIC FUNCTION PANEL
ALT: 23 U/L (ref 0–53)
AST: 18 U/L (ref 0–37)
Albumin: 4.8 g/dL (ref 3.5–5.2)
Alkaline Phosphatase: 74 U/L (ref 39–117)
Bilirubin, Direct: 0.2 mg/dL (ref 0.0–0.3)
Total Bilirubin: 0.9 mg/dL (ref 0.2–1.2)
Total Protein: 7 g/dL (ref 6.0–8.3)

## 2021-12-16 LAB — TROPONIN I (HIGH SENSITIVITY): High Sens Troponin I: 4 ng/L (ref 2–17)

## 2021-12-16 LAB — PSA: PSA: 0.77 ng/mL (ref 0.10–4.00)

## 2021-12-16 NOTE — Progress Notes (Signed)
? ?Subjective:  ?Patient ID: Cory Joyce, male    DOB: 04/06/1956  Age: 66 y.o. MRN: [id number] ? ?CC: Annual Exam and Hyperlipidemia ? ?This visit occurred during the SARS-CoV-2 public health emergency.  Safety protocols were in place, including screening questions prior to the visit, additional usage of staff PPE, and extensive cleaning of exam room while observing appropriate contact time as indicated for disinfecting solutions.   ? ?HPI ?Cory Joyce presents for a CPX and f/up - ? ?He complains of a several year hx of DOE when climbing an incline. He denies CP, diaphoresis, edema, or fatigue. ? ?Outpatient Medications Prior to Visit  ?Medication Sig Dispense Refill  ? aspirin 81 MG tablet Take 81 mg by mouth daily.    ? atorvastatin (LIPITOR) 40 MG tablet TAKE 1 TABLET BY MOUTH EVERY DAY 90 tablet 0  ? celecoxib (CELEBREX) 200 MG capsule Take 200 mg by mouth daily.    ? methylphenidate (RITALIN) 10 MG tablet Take 10 mg by mouth every evening.    ? Methylphenidate 25.9 MG TBED Take 2 tablets by mouth daily with breakfast.     ? Multiple Vitamin (MULTIVITAMIN WITH MINERALS) TABS tablet Take 1 tablet by mouth daily.    ? UNABLE TO FIND Take 2 capsules by mouth in the morning and at bedtime. Med Name: ArteClear Blood Pressure    ? B Complex Vitamins (B COMPLEX PO) Take by mouth.    ? Cholecalciferol (VITAMIN D3 PO) Take by mouth.    ? Omega-3 Fatty Acids (FISH OIL PO) Take by mouth.    ? ?No facility-administered medications prior to visit.  ? ? ?ROS ?Review of Systems  ?Constitutional:  Positive for unexpected weight change (wt gain). Negative for appetite change, chills, diaphoresis and fatigue.  ?HENT: Negative.    ?Eyes: Negative.   ?Respiratory:  Positive for shortness of breath. Negative for cough, chest tightness and wheezing.   ?Cardiovascular:  Negative for chest pain, palpitations and leg swelling.  ?Gastrointestinal:  Negative for abdominal pain, diarrhea, nausea and vomiting.   ?Endocrine: Negative.   ?Genitourinary: Negative.   ?Musculoskeletal:  Positive for arthralgias. Negative for myalgias.  ?Skin: Negative.   ?Neurological: Negative.  Negative for dizziness, weakness and light-headedness.  ?Hematological: Negative.   ?Psychiatric/Behavioral: Negative.    ? ?Objective:  ?BP 128/88 (BP Location: Left Arm, Patient Position: Sitting, Cuff Size: Large)   Pulse 64   Temp 98.1 ?F (36.7 ?C) (Oral)   Resp 16   Ht '5\' 10"'$  (1.778 m)   Wt 195 lb 12.8 oz (88.8 kg)   SpO2 98%   BMI 28.09 kg/m?  ? ?BP Readings from Last 3 Encounters:  ?12/16/21 128/88  ?04/24/21 (!) 142/82  ?12/16/20 (!) 96/56  ? ? ?Wt Readings from Last 3 Encounters:  ?12/16/21 195 lb 12.8 oz (88.8 kg)  ?04/24/21 193 lb 9.6 oz (87.8 kg)  ?12/16/20 190 lb (86.2 kg)  ? ? ?Physical Exam ?Vitals reviewed.  ?HENT:  ?   Nose: Nose normal.  ?   Mouth/Throat:  ?   Mouth: Mucous membranes are moist.  ?Eyes:  ?   General: No scleral icterus. ?   Conjunctiva/sclera: Conjunctivae normal.  ?Cardiovascular:  ?   Rate and Rhythm: Normal rate and regular rhythm.  ?   Heart sounds: No murmur heard. ?   Comments: EKG- ?NSR, 68 bpm ??LAE ?No LVH or Q waves ?Pulmonary:  ?   Effort: Pulmonary effort is normal.  ?   Breath sounds: No  stridor. No wheezing, rhonchi or rales.  ?Abdominal:  ?   General: Abdomen is flat.  ?   Palpations: There is no mass.  ?   Tenderness: There is no abdominal tenderness. There is no guarding or rebound.  ?   Hernia: No hernia is present. There is no hernia in the left inguinal area or right inguinal area.  ?Genitourinary: ?   Pubic Area: No rash.   ?   Penis: Normal and circumcised.   ?   Testes: Normal.  ?   Epididymis:  ?   Right: Normal.  ?   Left: Normal.  ?   Prostate: Normal. Not enlarged, not tender and no nodules present.  ?   Rectum: Normal. Guaiac result negative. No mass, tenderness, anal fissure, external hemorrhoid or internal hemorrhoid. Normal anal tone.  ?Musculoskeletal:  ?   Cervical back: Neck  supple.  ?   Right lower leg: No edema.  ?   Left lower leg: No edema.  ?Lymphadenopathy:  ?   Cervical: No cervical adenopathy.  ?   Lower Body: No right inguinal adenopathy. No left inguinal adenopathy.  ?Neurological:  ?   Mental Status: He is alert.  ? ? ?Lab Results  ?Component Value Date  ? WBC 6.7 12/16/2021  ? HGB 16.5 12/16/2021  ? HCT 47.7 12/16/2021  ? PLT 301.0 12/16/2021  ? GLUCOSE 107 (H) 12/16/2021  ? CHOL 130 12/16/2021  ? TRIG 86.0 12/16/2021  ? HDL 49.10 12/16/2021  ? LDLDIRECT 136.1 09/11/2010  ? [place] 64 12/16/2021  ? ALT 23 12/16/2021  ? AST 18 12/16/2021  ? NA 140 12/16/2021  ? K 4.4 12/16/2021  ? CL 103 12/16/2021  ? CREATININE 1.13 12/16/2021  ? BUN 16 12/16/2021  ? CO2 29 12/16/2021  ? TSH 1.08 12/16/2021  ? PSA 0.77 12/16/2021  ? HGBA1C 5.8 08/05/2020  ? ? ?MR SHOULDER RIGHT W CONTRAST ? ?Result Date: 09/15/2017 ?CLINICAL DATA:  Chronic right shoulder pain. EXAM: MR ARTHROGRAM OF THE RIGHT SHOULDER TECHNIQUE: Multiplanar, multisequence MR imaging of the right shoulder was performed following the administration of intra-articular contrast. CONTRAST:  See Injection Documentation. COMPARISON:  None. FINDINGS: Rotator cuff: Mild tendinosis of the supraspinatus and infraspinatus tendons. Teres minor tendon is intact. Subscapularis tendon is intact. Muscles: No atrophy or fatty replacement of nor abnormal signal within, the muscles of the rotator cuff. Biceps long head: Intact. Acromioclavicular Joint: Mild arthropathy of the acromioclavicular joint with marrow edema on either side of the joint. Type I acromion. Trace subacromial/ subdeltoid bursal fluid. Glenohumeral Joint: Intraarticular contrast distending the joint capsule. No chondral defect. Normal glenohumeral ligaments. Labrum: Superior posterior labral tear. Small amount of contrast undercutting the superior anterior labrum which may reflect a prominent sulcus versus a tear given its continuity with the superior posterior labral tear.  Bones: No focal marrow signal abnormality. No fracture or dislocation. IMPRESSION: 1. Superior posterior labral tear. Small amount of contrast undercutting the superior anterior labrum which may reflect a prominent sulcus versus a tear given its continuity with the superior posterior labral tear. 2. Mild tendinosis of the supraspinatus and infraspinatus tendons. Electronically Signed   By: Kathreen Devoid   On: 09/15/2017 16:33  ? ?Arthrogram ? ?Result Date: 09/15/2017 ?CLINICAL DATA:  Right shoulder pain. EXAM: RIGHT SHOULDER INJECTION UNDER FLUOROSCOPY TECHNIQUE: An appropriate skin entrance site was determined. The site was marked, prepped with Betadine, draped in the usual sterile fashion, and infiltrated locally with buffered Lidocaine. A 22 gauge spinal needle  was advanced to the superomedial margin of the humeral head under intermittent fluoroscopy. 1 mL of 1% lidocaine injected easily. A mixture of 0.05 mL of MultiHance, 5 mL of 1% lidocaine, and 15 mL of Isovue-M 200 was then used to opacify the right shoulder capsule. 12 mL of this mixture were injected. No immediate complication. FLUOROSCOPY TIME:  Fluoroscopy Time:  2 seconds Radiation Exposure Index (if provided by the fluoroscopic device): 10.77 microGray*m^2 Number of Acquired Spot Images: 0 IMPRESSION: Technically successful right shoulder injection for MRI. Electronically Signed   By: Logan Bores M.D.   On: 09/15/2017 16:26  ? ? ?Assessment & Plan:  ? ?Jaysion was seen today for annual exam and hyperlipidemia. ? ?Diagnoses and all orders for this visit: ? ?Routine general medical examination at a health care facility- Exam completed, labs reviewed, vaccines reviewed and updated, cancer screenings are UTD, patient education was given. ?-     Lipid panel; Future ?-     PSA; Future ?-     PSA ?-     Lipid panel ? ?DOE (dyspnea on exertion)- His labs and EKG are reassuring.  This could be decreased conditioning.  I have asked him to go undergo a coronary  calcium score to screen for atherosclerosis. ?-     Basic metabolic panel; Future ?-     CBC with Differential/Platelet; Future ?-     TSH; Future ?-     Hepatic function panel; Future ?-     CT CARDIAC SCORING (SELF

## 2021-12-16 NOTE — Patient Instructions (Signed)

## 2022-02-05 ENCOUNTER — Other Ambulatory Visit: Payer: Self-pay | Admitting: Internal Medicine

## 2022-02-05 DIAGNOSIS — E785 Hyperlipidemia, unspecified: Secondary | ICD-10-CM

## 2022-02-23 ENCOUNTER — Ambulatory Visit
Admission: RE | Admit: 2022-02-23 | Discharge: 2022-02-23 | Disposition: A | Discharge status: Home / Self Care | Payer: Self-pay | Source: Ambulatory Visit | Attending: Internal Medicine | Admitting: Internal Medicine

## 2022-02-23 DIAGNOSIS — R0609 Other forms of dyspnea: Secondary | ICD-10-CM

## 2022-02-24 ENCOUNTER — Other Ambulatory Visit: Payer: Self-pay | Admitting: Internal Medicine

## 2022-03-08 ENCOUNTER — Ambulatory Visit (INDEPENDENT_AMBULATORY_CARE_PROVIDER_SITE_OTHER): Payer: BC Managed Care – PPO

## 2022-03-08 DIAGNOSIS — Z23 Encounter for immunization: Secondary | ICD-10-CM | POA: Diagnosis not present

## 2022-04-19 ENCOUNTER — Ambulatory Visit (INDEPENDENT_AMBULATORY_CARE_PROVIDER_SITE_OTHER): Payer: BC Managed Care – PPO | Admitting: Internal Medicine

## 2022-04-19 VITALS — BP 136/84 | HR 62 | Temp 97.7°F | Ht 70.0 in | Wt 187.4 lb

## 2022-04-19 DIAGNOSIS — E785 Hyperlipidemia, unspecified: Secondary | ICD-10-CM | POA: Diagnosis not present

## 2022-04-19 DIAGNOSIS — I10 Essential (primary) hypertension: Secondary | ICD-10-CM | POA: Diagnosis not present

## 2022-04-19 NOTE — Progress Notes (Signed)
Subjective:  Patient ID: Cory Joyce, male    DOB: September 27, 1956  Age: 66 y.o. MRN: [id number]  CC: Hypertension and Hyperlipidemia   HPI Cory Joyce presents for f/up -  His recent blood pressure at home was 128/84.  He is active and denies chest pain, shortness of breath, diaphoresis, dizziness, lightheadedness, or edema.  Outpatient Medications Prior to Visit  Medication Sig Dispense Refill   aspirin 81 MG tablet Take 81 mg by mouth daily.     atorvastatin (LIPITOR) 40 MG tablet TAKE 1 TABLET BY MOUTH EVERY DAY 90 tablet 0   celecoxib (CELEBREX) 200 MG capsule Take 200 mg by mouth daily.     methylphenidate (RITALIN) 10 MG tablet Take 10 mg by mouth every evening.     Methylphenidate 25.9 MG TBED Take 2 tablets by mouth daily with breakfast.      Multiple Vitamin (MULTIVITAMIN WITH MINERALS) TABS tablet Take 1 tablet by mouth daily.     UNABLE TO FIND Take 2 capsules by mouth in the morning and at bedtime. Med Name: ArteClear Blood Pressure     No facility-administered medications prior to visit.    ROS Review of Systems  Constitutional:  Negative for diaphoresis and fatigue.  HENT: Negative.    Eyes: Negative.   Respiratory:  Negative for cough, chest tightness, shortness of breath and wheezing.   Cardiovascular:  Negative for chest pain, palpitations and leg swelling.  Gastrointestinal:  Negative for abdominal pain, constipation, diarrhea, nausea and vomiting.  Endocrine: Negative.   Genitourinary: Negative.  Negative for difficulty urinating.  Musculoskeletal: Negative.   Skin: Negative.   Neurological: Negative.  Negative for dizziness and weakness.  Hematological:  Negative for adenopathy. Does not bruise/bleed easily.    Objective:  BP 136/84 (BP Location: Left Arm, Patient Position: Sitting, Cuff Size: Large)   Pulse 62   Temp 97.7 F (36.5 C) (Oral)   Ht '5\' 10"'$  (1.778 m)   Wt 187 lb 6 oz (85 kg)   SpO2 98%   BMI 26.89 kg/m   BP  Readings from Last 3 Encounters:  04/19/22 136/84  12/16/21 128/88  04/24/21 (!) 142/82    Wt Readings from Last 3 Encounters:  04/19/22 187 lb 6 oz (85 kg)  12/16/21 195 lb 12.8 oz (88.8 kg)  04/24/21 193 lb 9.6 oz (87.8 kg)    Physical Exam Vitals reviewed.  HENT:     Mouth/Throat:     Mouth: Mucous membranes are moist.  Eyes:     General: No scleral icterus.    Conjunctiva/sclera: Conjunctivae normal.  Cardiovascular:     Rate and Rhythm: Regular rhythm.     Heart sounds: No murmur heard. Pulmonary:     Effort: Pulmonary effort is normal.     Breath sounds: No stridor. No wheezing, rhonchi or rales.  Abdominal:     General: Abdomen is flat.     Palpations: There is no mass.     Tenderness: There is no abdominal tenderness. There is no guarding.     Hernia: No hernia is present.  Musculoskeletal:        General: No swelling. Normal range of motion.     Cervical back: Neck supple.     Right lower leg: No edema.     Left lower leg: No edema.  Lymphadenopathy:     Cervical: No cervical adenopathy.  Skin:    General: Skin is warm and dry.  Neurological:     General: No focal  deficit present.     Mental Status: He is alert.  Psychiatric:        Mood and Affect: Mood normal.        Behavior: Behavior normal.     Lab Results  Component Value Date   WBC 6.7 12/16/2021   HGB 16.5 12/16/2021   HCT 47.7 12/16/2021   PLT 301.0 12/16/2021   GLUCOSE 107 (H) 12/16/2021   CHOL 130 12/16/2021   TRIG 86.0 12/16/2021   HDL 49.10 12/16/2021   LDLDIRECT 136.1 09/11/2010   LDLCALC 64 12/16/2021   ALT 23 12/16/2021   AST 18 12/16/2021   NA 140 12/16/2021   K 4.4 12/16/2021   CL 103 12/16/2021   CREATININE 1.13 12/16/2021   BUN 16 12/16/2021   CO2 29 12/16/2021   TSH 1.08 12/16/2021   PSA 0.77 12/16/2021   HGBA1C 5.8 08/05/2020    CT CARDIAC SCORING (SELF PAY ONLY)  Addendum Date: 02/23/2022   ADDENDUM REPORT: 02/23/2022 23:52 CLINICAL DATA:  Cardiovascular  Disease Risk stratification EXAM: Coronary Calcium Score TECHNIQUE: A gated, non-contrast computed tomography scan of the heart was performed using 72m slice thickness. Axial images were analyzed on a dedicated workstation. Calcium scoring of the coronary arteries was performed using the Agatston method. FINDINGS: Coronary arteries: Normal origins. Coronary Calcium Score: Left main: Left anterior descending artery: 35 Left circumflex artery: Right coronary artery: 1 Total: 35 Percentile: 42 Pericardium: Normal. Ascending Aorta: Normal caliber. Non-cardiac: [facility]. IMPRESSION: Coronary calcium score of 35. This was 42nd percentile for age-, race-, and sex-matched controls. RECOMMENDATIONS: Coronary artery calcium (CAC) score is a strong predictor of incident coronary heart disease (CHD) and provides predictive information beyond traditional risk factors. CAC scoring is reasonable to use in the decision to withhold, postpone, or initiate statin therapy in intermediate-risk or selected borderline-risk asymptomatic adults (age 65-75years and LDL-C >=70 to <190 mg/dL) who do not have diabetes or established atherosclerotic cardiovascular disease (ASCVD).* In intermediate-risk (10-year ASCVD risk >=7.5% to <20%) adults or selected borderline-risk (10-year ASCVD risk >=5% to <7.5%) adults in whom a CAC score is measured for the purpose of making a treatment decision the following recommendations have been made: If CAC=0, it is reasonable to withhold statin therapy and reassess in 5 to 10 years, as long as higher risk conditions are absent (diabetes mellitus, family history of premature CHD in first degree relatives (males <55 years; females <65 years), cigarette smoking, or LDL >=190 mg/dL). If CAC is 1 to 99, it is reasonable to initiate statin therapy for patients >=534years of age. If CAC is >=100 or >=75th percentile, it is reasonable to initiate statin therapy at any age.  Cardiology referral should be considered for patients with CAC scores >=400 or >=75th percentile. *2018 AHA/ACC/AACVPR/AAPA/ABC/ACPM/ADA/AGS/APhA/ASPC/NLA/PCNA Guideline on the Management of Blood Cholesterol: A Report of the American College of Cardiology/American Heart Association Task Force on Clinical Practice Guidelines. J Am Coll Cardiol. 2019;73(24):3168-3209. COswaldo Milian MD Electronically Signed   By: COswaldo MilianM.D.   On: 02/23/2022 23:52   Result Date: 02/23/2022 EXAM: OVER-READ INTERPRETATION  CT CHEST The following report is a limited chest CT over-read performed by radiologist Dr. DVinnie Langtonof [facility], [place] 02/23/2022. The coronary calcium score interpretation by the cardiologist is attached. COMPARISON:  None Available. FINDINGS: Within the visualized portions of the thorax there are no suspicious appearing pulmonary nodules or masses, there is no acute consolidative airspace disease, no pleural effusions, no pneumothorax  and no lymphadenopathy. Visualized portions of the upper abdomen are unremarkable. There are no aggressive appearing lytic or blastic lesions noted in the visualized portions of the skeleton. IMPRESSION: 1. No significant incidental noncardiac findings are noted. Electronically Signed: By: Vinnie Langton M.D. On: 02/23/2022 07:59    Assessment & Plan:   Maxmilian was seen today for hypertension and hyperlipidemia.  Diagnoses and all orders for this visit:  Primary hypertension-his blood pressure is adequately well controlled.  Hyperlipidemia with target LDL less than 130-LDL goal achieved. Doing well on the statin    I am having Cory Joyce. Cory Joyce "Ted" maintain his aspirin, Methylphenidate, methylphenidate, celecoxib, multivitamin with minerals, UNABLE TO FIND, and atorvastatin.  No orders of the defined types were placed in this encounter.    Follow-up: No follow-ups on file.  Scarlette Calico, MD

## 2022-04-23 ENCOUNTER — Encounter: Payer: Self-pay | Admitting: Internal Medicine

## 2022-04-23 DIAGNOSIS — I10 Essential (primary) hypertension: Secondary | ICD-10-CM | POA: Insufficient documentation

## 2022-04-28 NOTE — Progress Notes (Signed)
04/24/21- 22 yoM never smoker for sleep evaluation self referred for OSA. Former patient of Dr DeDios HST 19/29/17- AHI 15/ hr, desaturation to 77%, body weight 188 lbs  CPAP 9 Medical problem list includes Hearing loss, ADHD, HTN,  Meds include- Ritalin 10 mg 1 each evening, methylphenidate 25.9 mg 2 daily Epworth score Body weight today- 193 lbs Download- compliance 100%, AHI 2.2/ hr Covid vax- 4 [employer]  (4th was 01/25/21) Sleeps better and feels better using CPAP. Machine is 65 years old and we discussed replacement.  We also discussed alternatives CPAP and he wanted to learn more about oral appliance option.  04/29/22- 65 yoM never smoker followed for OSA, complicated by Hearing loss, ADHD, HTN,   -Ritalin 10 mg 1 each evening, methylphenidate 25.9 mg 2 daily CPAP 9/ [facility]   order replacement 04/24/22 Referral to Dr Ron Parker to consider OAP 04/24/21 with change to auto 5-15? Not done Body weight today-186 lbs Download compliance 97%, AHI 1.6/ hr -----Pt f/u no issues w/ CPAP currently, in the process on working on another orthodontic device. Getting approx 6hrs/night w/ CPAP.  He is hanging on to old CPAP machine while OAP is adjusted.  ROS- + = positive Constitutional:    weight loss, night sweats, fevers, chills, fatigue, lassitude. HEENT:    headaches, difficulty swallowing, tooth/dental problems, sore throat,       sneezing, itching, ear ache, nasal congestion, post nasal drip, snoring, + hearing loss, CV:    chest pain, orthopnea, PND, swelling in lower extremities, anasarca,                                   dizziness, palpitations Resp:   shortness of breath with exertion or at rest.                productive cough,   non-productive cough, coughing up of blood.              change in color of mucus.  wheezing.   Skin:    rash or lesions. GI:  No-   heartburn, indigestion, abdominal pain, nausea, vomiting, diarrhea,                 change in bowel habits, loss of appetite GU: dysuria,  change in color of urine, no urgency or frequency.   flank pain. MS:   joint pain, stiffness, decreased range of motion, back pain. Neuro-     +ADHD- treated Psych:  change in mood or affect.  depression or anxiety.   memory loss.  OBJ- Physical Exam General- Alert, Oriented, Affect-appropriate, Distress- none acute, not obese Skin- rash-none, lesions- none, excoriation- none Lymphadenopathy- none Head- atraumatic            Eyes- Gross vision intact, PERRLA, conjunctivae and secretions clear            Ears- Hearing, canals-normal            Nose- Clear, no-Septal dev, mucus, polyps, erosion, perforation             Throat- Mallampati II , mucosa clear , drainage- none, tonsils- atrophic, + teeth Neck- flexible , trachea midline, no stridor , thyroid nl, carotid no bruit Chest - symmetrical excursion , unlabored           Heart/CV- RRR , no murmur , no gallop  , no rub, nl s1 s2                           -  JVD- none , edema- none, stasis changes- none, varices- none           Lung- clear to P&A, wheeze- none, cough- none , dullness-none, rub- none           Chest wall-  Abd-  Br/ Gen/ Rectal- Not done, not indicated Extrem- cyanosis- none, clubbing, none, atrophy- none, strength- nl Neuro- grossly intact to observation

## 2022-04-29 ENCOUNTER — Encounter: Payer: Self-pay | Admitting: Internal Medicine

## 2022-04-29 ENCOUNTER — Ambulatory Visit (INDEPENDENT_AMBULATORY_CARE_PROVIDER_SITE_OTHER): Payer: BC Managed Care – PPO | Admitting: Internal Medicine

## 2022-04-29 DIAGNOSIS — G4733 Obstructive sleep apnea (adult) (pediatric): Secondary | ICD-10-CM

## 2022-04-29 DIAGNOSIS — F909 Attention-deficit hyperactivity disorder, unspecified type: Secondary | ICD-10-CM

## 2022-04-29 NOTE — Patient Instructions (Signed)
We will be happy to order a replacement for your old CPAP machine if you decide to do that.  For now, continue as planned, to take your oral appliance on your [place] trip. Hope that goes well.

## 2022-05-03 ENCOUNTER — Other Ambulatory Visit: Payer: Self-pay | Admitting: Internal Medicine

## 2022-05-03 DIAGNOSIS — E785 Hyperlipidemia, unspecified: Secondary | ICD-10-CM

## 2022-05-30 ENCOUNTER — Encounter: Payer: Self-pay | Admitting: Internal Medicine

## 2022-05-30 NOTE — Assessment & Plan Note (Signed)
He expects to mainly use oral appliance once Dr Ron Parker gets it adjusted. Download still shows good compliance and control Plan- CPAP and/ or oral appliance

## 2022-05-30 NOTE — Assessment & Plan Note (Signed)
Her states this is under control

## 2022-06-09 ENCOUNTER — Encounter: Payer: Self-pay | Admitting: Internal Medicine

## 2022-07-17 ENCOUNTER — Telehealth: Payer: BC Managed Care – PPO | Admitting: Physician Assistant

## 2022-07-17 DIAGNOSIS — B9689 Other specified bacterial agents as the cause of diseases classified elsewhere: Secondary | ICD-10-CM

## 2022-07-17 DIAGNOSIS — Z8616 Personal history of COVID-19: Secondary | ICD-10-CM | POA: Diagnosis not present

## 2022-07-17 DIAGNOSIS — J208 Acute bronchitis due to other specified organisms: Secondary | ICD-10-CM

## 2022-07-17 MED ORDER — DOXYCYCLINE HYCLATE 100 MG PO TABS: 100.0000 mg | ORAL_TABLET | Freq: Two times a day (BID) | ORAL | 0 refills | Status: DC

## 2022-07-17 MED ORDER — BENZONATATE 100 MG PO CAPS: 100.0000 mg | ORAL_CAPSULE | Freq: Three times a day (TID) | ORAL | 0 refills | Status: DC | PRN

## 2022-07-17 NOTE — Progress Notes (Signed)
Message sent to patient requesting further input regarding current symptoms. Awaiting patient response.  

## 2022-07-17 NOTE — Progress Notes (Signed)
We are sorry that you are not feeling well.  Here is how we plan to help!  Based on your presentation I believe you most likely have developed a secondary bronchitis from recent COVID infection. As such, I am starting you on an antibiotic, Doxycycline, to take as directed. I am also sending in a prescription cough medication that is safe to use along with OTC medications to better control the cough. No need to continue testing in your case. For quarantine, as long as you are fever free and 5 days past symptoms onset, you can end the quarantine but should continue to mask until 10 days have passed from onset of symptoms. Since you are right at that window, I would just recommend continuing to mask until symptoms resolve, but no further quarantine is needed. Your wife does not have to quarantine as long as no symptoms, but should be monitored closely.     From your responses in the eVisit questionnaire you describe inflammation in the upper respiratory tract which is causing a significant cough.  This is commonly called Bronchitis and has four common causes:   Allergies Viral Infections Acid Reflux Bacterial Infection Allergies, viruses and acid reflux are treated by controlling symptoms or eliminating the cause. An example might be a cough caused by taking certain blood pressure medications. You stop the cough by changing the medication. Another example might be a cough caused by acid reflux. Controlling the reflux helps control the cough.  USE OF BRONCHODILATOR ("RESCUE") INHALERS: There is a risk from using your bronchodilator too frequently.  The risk is that over-reliance on a medication which only relaxes the muscles surrounding the breathing tubes can reduce the effectiveness of medications prescribed to reduce swelling and congestion of the tubes themselves.  Although you feel brief relief from the bronchodilator inhaler, your asthma may actually be worsening with the tubes becoming more swollen  and filled with mucus.  This can delay other crucial treatments, such as oral steroid medications. If you need to use a bronchodilator inhaler daily, several times per day, you should discuss this with your provider.  There are probably better treatments that could be used to keep your asthma under control.     HOME CARE Only take medications as instructed by your medical team. Complete the entire course of an antibiotic. Drink plenty of fluids and get plenty of rest. Avoid close contacts especially the very young and the elderly Cover your mouth if you cough or cough into your sleeve. Always remember to wash your hands A steam or ultrasonic humidifier can help congestion.   GET HELP RIGHT AWAY IF: You develop worsening fever. You become short of breath You cough up blood. Your symptoms persist after you have completed your treatment plan MAKE SURE YOU  Understand these instructions. Will watch your condition. Will get help right away if you are not doing well or get worse.    Thank you for choosing an e-visit.  Your e-visit answers were reviewed by a board certified advanced clinical practitioner to complete your personal care plan. Depending upon the condition, your plan could have included both over the counter or prescription medications.  Please review your pharmacy choice. Make sure the pharmacy is open so you can pick up prescription now. If there is a problem, you may contact your provider through [employer] and have the prescription routed to another pharmacy.  Your safety is important to [place]. If you have drug allergies check your prescription carefully.  For the next 24 hours you can use MyChart to ask questions about today's visit, request a non-urgent call back, or ask for a work or school excuse. You will get an email in the next two days asking about your experience. I hope that your e-visit has been valuable and will speed your recovery.

## 2022-07-26 NOTE — Progress Notes (Signed)
I have spent 5 minutes in review of e-visit questionnaire, review and updating patient chart, medical decision making and response to patient.   Tyce Delcid Cody Joretta Eads, PA-C    

## 2022-08-03 ENCOUNTER — Other Ambulatory Visit: Payer: Self-pay | Admitting: Internal Medicine

## 2022-08-03 DIAGNOSIS — E785 Hyperlipidemia, unspecified: Secondary | ICD-10-CM

## 2022-10-30 ENCOUNTER — Other Ambulatory Visit: Payer: Self-pay | Admitting: Internal Medicine

## 2022-10-30 DIAGNOSIS — E785 Hyperlipidemia, unspecified: Secondary | ICD-10-CM

## 2022-12-21 ENCOUNTER — Ambulatory Visit (INDEPENDENT_AMBULATORY_CARE_PROVIDER_SITE_OTHER): Payer: BC Managed Care – PPO | Admitting: Internal Medicine

## 2022-12-21 ENCOUNTER — Encounter: Payer: Self-pay | Admitting: Internal Medicine

## 2022-12-21 VITALS — BP 134/78 | HR 71 | Temp 97.6°F | Resp 16 | Ht 70.0 in | Wt 190.0 lb

## 2022-12-21 DIAGNOSIS — Z23 Encounter for immunization: Secondary | ICD-10-CM | POA: Diagnosis not present

## 2022-12-21 DIAGNOSIS — Z0001 Encounter for general adult medical examination with abnormal findings: Secondary | ICD-10-CM | POA: Diagnosis not present

## 2022-12-21 DIAGNOSIS — Z125 Encounter for screening for malignant neoplasm of prostate: Secondary | ICD-10-CM

## 2022-12-21 DIAGNOSIS — I10 Essential (primary) hypertension: Secondary | ICD-10-CM | POA: Diagnosis not present

## 2022-12-21 DIAGNOSIS — B351 Tinea unguium: Secondary | ICD-10-CM | POA: Insufficient documentation

## 2022-12-21 DIAGNOSIS — Z0000 Encounter for general adult medical examination without abnormal findings: Secondary | ICD-10-CM

## 2022-12-21 DIAGNOSIS — E785 Hyperlipidemia, unspecified: Secondary | ICD-10-CM

## 2022-12-21 LAB — HEPATIC FUNCTION PANEL
ALT: 28 U/L (ref 0–53)
AST: 23 U/L (ref 0–37)
Albumin: 4.4 g/dL (ref 3.5–5.2)
Alkaline Phosphatase: 79 U/L (ref 39–117)
Bilirubin, Direct: 0.1 mg/dL (ref 0.0–0.3)
Total Bilirubin: 0.5 mg/dL (ref 0.2–1.2)
Total Protein: 6.7 g/dL (ref 6.0–8.3)

## 2022-12-21 LAB — BASIC METABOLIC PANEL
BUN: 18 mg/dL (ref 6–23)
CO2: 31 mEq/L (ref 19–32)
Calcium: 9.4 mg/dL (ref 8.4–10.5)
Chloride: 101 mEq/L (ref 96–112)
Creatinine, Ser: 1.1 mg/dL (ref 0.40–1.50)
GFR: 70.58 mL/min (ref >60.00)
Glucose, Bld: 109 mg/dL — ABNORMAL HIGH (ref 70–99)
Potassium: 4.5 mEq/L (ref 3.5–5.1)
Sodium: 138 mEq/L (ref 135–145)

## 2022-12-21 LAB — CBC WITH DIFFERENTIAL/PLATELET
Basophils Absolute: 0 10*3/uL (ref 0.0–0.1)
Basophils Relative: 0.7 % (ref 0.0–3.0)
Eosinophils Absolute: 0.3 10*3/uL (ref 0.0–0.7)
Eosinophils Relative: 5.9 % — ABNORMAL HIGH (ref 0.0–5.0)
HCT: 42.1 % (ref 39.0–52.0)
Hemoglobin: 14.9 g/dL (ref 13.0–17.0)
Lymphocytes Relative: 51.7 % — ABNORMAL HIGH (ref 12.0–46.0)
Lymphs Abs: 2.9 10*3/uL (ref 0.7–4.0)
MCHC: 35.4 g/dL (ref 30.0–36.0)
MCV: 86.4 fl (ref 78.0–100.0)
Monocytes Absolute: 0.5 10*3/uL (ref 0.1–1.0)
Monocytes Relative: 8.4 % (ref 3.0–12.0)
Neutro Abs: 1.9 10*3/uL (ref 1.4–7.7)
Neutrophils Relative %: 33.3 % — ABNORMAL LOW (ref 43.0–77.0)
Platelets: 291 10*3/uL (ref 150.0–400.0)
RBC: 4.88 Mil/uL (ref 4.22–5.81)
RDW: 13.6 % (ref 11.5–15.5)
WBC: 5.6 10*3/uL (ref 4.0–10.5)

## 2022-12-21 LAB — URINALYSIS, ROUTINE W REFLEX MICROSCOPIC
Bilirubin Urine: NEGATIVE
Hgb urine dipstick: NEGATIVE
Ketones, ur: NEGATIVE
Leukocytes,Ua: NEGATIVE
Nitrite: NEGATIVE
RBC / HPF: NONE SEEN (ref 0–?)
Specific Gravity, Urine: <=1.005 — AB (ref 1.000–1.030)
Total Protein, Urine: NEGATIVE
Urine Glucose: NEGATIVE
Urobilinogen, UA: 0.2 (ref 0.0–1.0)
WBC, UA: NONE SEEN (ref 0–?)
pH: 6 (ref 5.0–8.0)

## 2022-12-21 LAB — PSA: PSA: 1.03 ng/mL (ref 0.10–4.00)

## 2022-12-21 LAB — TSH: TSH: 1.46 u[IU]/mL (ref 0.35–5.50)

## 2022-12-21 NOTE — Progress Notes (Signed)
Subjective:  Patient ID: Cory Joyce, male    DOB: [birth year]  Age: 67 y.o. MRN: [id number]  CC: Annual Exam, Hypertension, and Hyperlipidemia   HPI [name] presents for a CPX and f/up ---  He is active and denies chest pain, shortness of breath, diaphoresis, or edema.  He complains of white and uncomfortable toenails on his right foot.  This has not responded to topical over-the-counter agents.  Outpatient Medications Prior to Visit  Medication Sig Dispense Refill   aspirin 81 MG tablet Take 81 mg by mouth daily.     atorvastatin (LIPITOR) 40 MG tablet TAKE 1 TABLET BY MOUTH EVERY DAY 90 tablet 0   celecoxib (CELEBREX) 200 MG capsule Take 200 mg by mouth daily.     methylphenidate (RITALIN) 10 MG tablet Take 10 mg by mouth every evening.     Methylphenidate 25.9 MG TBED Take 2 tablets by mouth daily with breakfast.      Multiple Vitamin (MULTIVITAMIN WITH MINERALS) TABS tablet Take 1 tablet by mouth daily.     UNABLE TO FIND Take 2 capsules by mouth in the morning and at bedtime. Med Name: ArteClear Blood Pressure     benzonatate (TESSALON) 100 MG capsule Take 1 capsule (100 mg total) by mouth 3 (three) times daily as needed for cough. 30 capsule 0   doxycycline (VIBRA-TABS) 100 MG tablet Take 1 tablet (100 mg total) by mouth 2 (two) times daily. 14 tablet 0   No facility-administered medications prior to visit.    ROS Review of Systems  Constitutional: Negative.  Negative for appetite change, diaphoresis, fatigue and unexpected weight change.  HENT: Negative.    Eyes: Negative.   Respiratory:  Negative for cough, chest tightness, shortness of breath and wheezing.   Cardiovascular:  Negative for chest pain, palpitations and leg swelling.  Gastrointestinal:  Negative for abdominal pain, constipation, diarrhea, nausea and vomiting.  Endocrine: Negative.   Genitourinary: Negative.  Negative for difficulty urinating, scrotal swelling and testicular  pain.  Musculoskeletal: Negative.  Negative for arthralgias and myalgias.  Skin: Negative.   Neurological: Negative.  Negative for dizziness and weakness.  Hematological:  Negative for adenopathy. Does not bruise/bleed easily.  Psychiatric/Behavioral: Negative.      Objective:  BP 134/78 (BP Location: Left Arm, Patient Position: Sitting, Cuff Size: Large)   Pulse 71   Temp 97.6 F (36.4 C) (Oral)   Resp 16   Ht 5\' 10"  (1.778 m)   Wt 190 lb (86.2 kg)   SpO2 97%   BMI 27.26 kg/m   BP Readings from Last 3 Encounters:  12/21/22 134/78  04/29/22 124/80  04/19/22 136/84    Wt Readings from Last 3 Encounters:  12/21/22 190 lb (86.2 kg)  04/29/22 186 lb 4.8 oz (84.5 kg)  04/19/22 187 lb 6 oz (85 kg)    Physical Exam Vitals reviewed.  HENT:     Nose: Nose normal.     Mouth/Throat:     Mouth: Mucous membranes are moist.  Eyes:     General: No scleral icterus.    Conjunctiva/sclera: Conjunctivae normal.  Cardiovascular:     Rate and Rhythm: Normal rate and regular rhythm.     Pulses:          Dorsalis pedis pulses are 1+ on the right side and 1+ on the left side.       Posterior tibial pulses are 1+ on the right side and 1+ on the left side.  Heart sounds: No murmur heard.    Comments: EKG- NSR, 64 bpm No LVH or Q waves Normal EKG Pulmonary:     Effort: Pulmonary effort is normal.     Breath sounds: No stridor. No wheezing, rhonchi or rales.  Abdominal:     General: Abdomen is flat.     Palpations: There is no mass.     Tenderness: There is no abdominal tenderness. There is no guarding.     Hernia: No hernia is present. There is no hernia in the left inguinal area or right inguinal area.  Genitourinary:    Pubic Area: No rash.      Penis: Normal and circumcised.      Testes: Normal.     Epididymis:     Right: Normal.     Left: Normal.     Prostate: Normal. Not enlarged, not tender and no nodules present.     Rectum: Normal. Guaiac result negative. No mass,  tenderness, anal fissure, external hemorrhoid or internal hemorrhoid. Normal anal tone.  Musculoskeletal:     Cervical back: Neck supple.     Right lower leg: No edema.     Left lower leg: No edema.  Feet:     Right foot:     Skin integrity: Skin integrity normal.     Toenail Condition: Fungal disease present.    Left foot:     Skin integrity: Skin integrity normal.     Toenail Condition: Left toenails are normal.  Lymphadenopathy:     Cervical: No cervical adenopathy.     Lower Body: No right inguinal adenopathy. No left inguinal adenopathy.  Skin:    General: Skin is warm and dry.  Neurological:     General: No focal deficit present.     Mental Status: He is alert and oriented to person, place, and time.  Psychiatric:        Mood and Affect: Mood normal.        Behavior: Behavior normal.     Lab Results  Component Value Date   WBC 5.6 12/21/2022   HGB 14.9 12/21/2022   HCT 42.1 12/21/2022   PLT 291.0 12/21/2022   GLUCOSE 109 (H) 12/21/2022   CHOL 130 12/16/2021   TRIG 86.0 12/16/2021   HDL 49.10 12/16/2021   LDLDIRECT 136.1 09/11/2010   LDLCALC 64 12/16/2021   ALT 28 12/21/2022   AST 23 12/21/2022   NA 138 12/21/2022   K 4.5 12/21/2022   CL 101 12/21/2022   CREATININE 1.10 12/21/2022   BUN 18 12/21/2022   CO2 31 12/21/2022   TSH 1.46 12/21/2022   PSA 1.03 12/21/2022   HGBA1C 5.8 08/05/2020    CT CARDIAC SCORING (SELF PAY ONLY)  Addendum Date: 02/23/2022   ADDENDUM REPORT: 02/23/2022 23:52 CLINICAL DATA:  Cardiovascular Disease Risk stratification EXAM: Coronary Calcium Score TECHNIQUE: A gated, non-contrast computed tomography scan of the heart was performed using 26mm slice thickness. Axial images were analyzed on a dedicated workstation. Calcium scoring of the coronary arteries was performed using the Agatston method. FINDINGS: Coronary arteries: Normal origins. Coronary Calcium Score: Left main: Left anterior descending artery: 35 Left circumflex artery: Right  coronary artery: 1 Total: 35 Percentile: 42 Pericardium: Normal. Ascending Aorta: Normal caliber. Non-cardiac: See separate report from [facility]. IMPRESSION: Coronary calcium score of 35. This was 42nd percentile for age-, race-, and sex-matched controls. RECOMMENDATIONS: Coronary artery calcium (CAC) score is a strong predictor of incident coronary heart disease (CHD) and provides predictive information beyond  traditional risk factors. CAC scoring is reasonable to use in the decision to withhold, postpone, or initiate statin therapy in intermediate-risk or selected borderline-risk asymptomatic adults (age 73-75 years and LDL-C >=70 to <190 mg/dL) who do not have diabetes or established atherosclerotic cardiovascular disease (ASCVD).* In intermediate-risk (10-year ASCVD risk >=7.5% to <20%) adults or selected borderline-risk (10-year ASCVD risk >=5% to <7.5%) adults in whom a CAC score is measured for the purpose of making a treatment decision the following recommendations have been made: If CAC=0, it is reasonable to withhold statin therapy and reassess in 5 to 10 years, as long as higher risk conditions are absent (diabetes mellitus, family history of premature CHD in first degree relatives (males <55 years; females <65 years), cigarette smoking, or LDL >=190 mg/dL). If CAC is 1 to 99, it is reasonable to initiate statin therapy for patients >=54 years of age. If CAC is >=100 or >=75th percentile, it is reasonable to initiate statin therapy at any age. Cardiology referral should be considered for patients with CAC scores >=400 or >=75th percentile. *2018 AHA/ACC/AACVPR/AAPA/ABC/ACPM/ADA/AGS/APhA/ASPC/NLA/PCNA Guideline on the Management of Blood Cholesterol: A Report of the American College of Cardiology/American Heart Association Task Force on Clinical Practice Guidelines. J Am Coll Cardiol. 2019;73(24):3168-3209. Oswaldo Milian, MD Electronically Signed   By: Oswaldo Milian M.D.   On:  02/23/2022 23:52   Result Date: 02/23/2022 EXAM: OVER-READ INTERPRETATION  CT CHEST The following report is a limited chest CT over-read performed by radiologist Dr. Vinnie Langton of [facility], [place] on 02/23/2022. The coronary calcium score interpretation by the cardiologist is attached. COMPARISON:  None Available. FINDINGS: Within the visualized portions of the thorax there are no suspicious appearing pulmonary nodules or masses, there is no acute consolidative airspace disease, no pleural effusions, no pneumothorax and no lymphadenopathy. Visualized portions of the upper abdomen are unremarkable. There are no aggressive appearing lytic or blastic lesions noted in the visualized portions of the skeleton. IMPRESSION: 1. No significant incidental noncardiac findings are noted. Electronically Signed: By: Vinnie Langton M.D. On: 02/23/2022 07:59    Assessment & Plan:  Primary hypertension- His blood pressure is well-controlled. -     TSH; Future -     Urinalysis, Routine w reflex microscopic; Future -     Hepatic function panel; Future -     CBC with Differential/Platelet; Future -     Basic metabolic panel; Future  Hyperlipidemia with target LDL less than 130 - LDL goal achieved. Doing well on the statin  -     TSH; Future -     Hepatic function panel; Future  Routine general medical examination at a health care facility- Exam completed, labs reviewed, vaccines reviewed and updated, cancer screenings are up-to-date, patient education was given. -     PSA; Future  Need for vaccination -     Pneumococcal conjugate vaccine 20-valent  Superficial white onychomycosis -     Terbinafine HCl; Take 1 tablet (250 mg total) by mouth daily.  Dispense: 90 tablet; Refill: 0     Follow-up: Return in about 6 months (around 06/23/2023).  Scarlette Calico, MD

## 2022-12-21 NOTE — Patient Instructions (Signed)
Health Maintenance, Male Adopting a healthy lifestyle and getting preventive care are important in promoting health and wellness. Ask your health care provider about: The right schedule for you to have regular tests and exams. Things you can do on your own to prevent diseases and keep yourself healthy. What should I know about diet, weight, and exercise? Eat a healthy diet  Eat a diet that includes plenty of vegetables, fruits, low-fat dairy products, and lean protein. Do not eat a lot of foods that are high in solid fats, added sugars, or sodium. Maintain a healthy weight Body mass index (BMI) is a measurement that can be used to identify possible weight problems. It estimates body fat based on height and weight. Your health care provider can help determine your BMI and help you achieve or maintain a healthy weight. Get regular exercise Get regular exercise. This is one of the most important things you can do for your health. Most adults should: Exercise for at least 150 minutes each week. The exercise should increase your heart rate and make you sweat (moderate-intensity exercise). Do strengthening exercises at least twice a week. This is in addition to the moderate-intensity exercise. Spend less time sitting. Even light physical activity can be beneficial. Watch cholesterol and blood lipids Have your blood tested for lipids and cholesterol at 66 years of age, then have this test every 5 years. You may need to have your cholesterol levels checked more often if: Your lipid or cholesterol levels are high. You are older than 66 years of age. You are at high risk for heart disease. What should I know about cancer screening? Many types of cancers can be detected early and may often be prevented. Depending on your health history and family history, you may need to have cancer screening at various ages. This may include screening for: Colorectal cancer. Prostate cancer. Skin cancer. Lung  cancer. What should I know about heart disease, diabetes, and high blood pressure? Blood pressure and heart disease High blood pressure causes heart disease and increases the risk of stroke. This is more likely to develop in people who have high blood pressure readings or are overweight. Talk with your health care provider about your target blood pressure readings. Have your blood pressure checked: Every 3-5 years if you are 18-39 years of age. Every year if you are 40 years old or older. If you are between the ages of 65 and 75 and are a current or former smoker, ask your health care provider if you should have a one-time screening for abdominal aortic aneurysm (AAA). Diabetes Have regular diabetes screenings. This checks your fasting blood sugar level. Have the screening done: Once every three years after age 45 if you are at a normal weight and have a low risk for diabetes. More often and at a younger age if you are overweight or have a high risk for diabetes. What should I know about preventing infection? Hepatitis B If you have a higher risk for hepatitis B, you should be screened for this virus. Talk with your health care provider to find out if you are at risk for hepatitis B infection. Hepatitis C Blood testing is recommended for: Everyone born from 1945 through 1965. Anyone with known risk factors for hepatitis C. Sexually transmitted infections (STIs) You should be screened each year for STIs, including gonorrhea and chlamydia, if: You are sexually active and are younger than 66 years of age. You are older than 66 years of age and your   health care provider tells you that you are at risk for this type of infection. Your sexual activity has changed since you were last screened, and you are at increased risk for chlamydia or gonorrhea. Ask your health care provider if you are at risk. Ask your health care provider about whether you are at high risk for HIV. Your health care provider  may recommend a prescription medicine to help prevent HIV infection. If you choose to take medicine to prevent HIV, you should first get tested for HIV. You should then be tested every 3 months for as long as you are taking the medicine. Follow these instructions at home: Alcohol use Do not drink alcohol if your health care provider tells you not to drink. If you drink alcohol: Limit how much you have to 0-2 drinks a day. Know how much alcohol is in your drink. In the [place] one drink equals one 12 oz bottle of beer (355 mL), one 5 oz glass of wine (148 mL), or one 1 oz glass of hard liquor (44 mL). Lifestyle Do not use any products that contain nicotine or tobacco. These products include cigarettes, chewing tobacco, and vaping devices, such as e-cigarettes. If you need help quitting, ask your health care provider. Do not use street drugs. Do not share needles. Ask your health care provider for help if you need support or information about quitting drugs. General instructions Schedule regular health, dental, and eye exams. Stay current with your vaccines. Tell your health care provider if: You often feel depressed. You have ever been abused or do not feel safe at home. Summary Adopting a healthy lifestyle and getting preventive care are important in promoting health and wellness. Follow your health care provider's instructions about healthy diet, exercising, and getting tested or screened for diseases. Follow your health care provider's instructions on monitoring your cholesterol and blood pressure. This information is not intended to replace advice given to you by your health care provider. Make sure you discuss any questions you have with your health care provider. Document Revised: 02/02/2021 Document Reviewed: 02/02/2021 Elsevier Patient Education  2023 [employer] Inc[employer]  

## 2022-12-26 MED ORDER — TERBINAFINE HCL 250 MG PO TABS: 250.0000 mg | ORAL_TABLET | Freq: Every day | ORAL | 0 refills | Status: DC

## 2023-01-06 NOTE — Addendum Note (Signed)
Addended by: Darryll Capers on: 01/06/2023 12:53 PM   Modules accepted: Orders

## 2023-01-23 ENCOUNTER — Other Ambulatory Visit: Payer: Self-pay | Admitting: Internal Medicine

## 2023-01-23 DIAGNOSIS — E785 Hyperlipidemia, unspecified: Secondary | ICD-10-CM

## 2023-02-26 ENCOUNTER — Other Ambulatory Visit: Payer: Self-pay | Admitting: Internal Medicine

## 2023-03-16 ENCOUNTER — Other Ambulatory Visit: Payer: Self-pay

## 2023-03-16 ENCOUNTER — Emergency Department (HOSPITAL_COMMUNITY)
Admission: EM | Admit: 2023-03-16 | Discharge: 2023-03-16 | Disposition: A | Discharge status: Home / Self Care | Payer: BC Managed Care – PPO | Attending: Emergency Medicine | Admitting: Emergency Medicine

## 2023-03-16 ENCOUNTER — Emergency Department (HOSPITAL_COMMUNITY): Payer: BC Managed Care – PPO

## 2023-03-16 DIAGNOSIS — W3182XA Contact with other commercial machinery, initial encounter: Secondary | ICD-10-CM | POA: Diagnosis not present

## 2023-03-16 DIAGNOSIS — Z23 Encounter for immunization: Secondary | ICD-10-CM | POA: Diagnosis not present

## 2023-03-16 DIAGNOSIS — Z7982 Long term (current) use of aspirin: Secondary | ICD-10-CM | POA: Insufficient documentation

## 2023-03-16 DIAGNOSIS — S62663B Nondisplaced fracture of distal phalanx of left middle finger, initial encounter for open fracture: Secondary | ICD-10-CM | POA: Diagnosis not present

## 2023-03-16 DIAGNOSIS — S60943A Unspecified superficial injury of left middle finger, initial encounter: Secondary | ICD-10-CM | POA: Diagnosis present

## 2023-03-16 MED ORDER — TETANUS-DIPHTH-ACELL PERTUSSIS 5-2.5-18.5 LF-MCG/0.5 IM SUSY
0.5000 mL | PREFILLED_SYRINGE | Freq: Once | INTRAMUSCULAR | Status: AC
  Administered 2023-03-16: 0.5 mL via INTRAMUSCULAR
  Filled 2023-03-16: qty 0.5

## 2023-03-16 MED ORDER — LIDOCAINE HCL (PF) 1 % IJ SOLN
5.0000 mL | Freq: Once | INTRAMUSCULAR | Status: AC
  Administered 2023-03-16: 5 mL
  Filled 2023-03-16: qty 30

## 2023-03-16 MED ORDER — CEPHALEXIN 500 MG PO CAPS: 500.0000 mg | ORAL_CAPSULE | Freq: Four times a day (QID) | ORAL | 0 refills | Status: DC

## 2023-03-16 MED ORDER — BACITRACIN ZINC 500 UNIT/GM EX OINT
TOPICAL_OINTMENT | Freq: Two times a day (BID) | CUTANEOUS | Status: DC
  Filled 2023-03-16: qty 0.9

## 2023-03-16 NOTE — ED Notes (Signed)
Xray in room now 

## 2023-03-16 NOTE — Discharge Instructions (Addendum)
Please pick up the antibiotics I have prescribed for you and call the orthopedic specialist I have attached your for you for further management of your finger.  Today your x-ray shows that you have a fracture at the end of your finger and with a laceration means that you have an open fracture.  This is a high risk wound so please keep the wound clean and dry.  You may use Neosporin on the wound when changing out the dressing.  If symptoms were to change or worsen please return to ER.

## 2023-03-16 NOTE — ED Triage Notes (Signed)
Left middle finger injury pt states caught it with a grinder tool. Bleeding controlled in triage

## 2023-03-16 NOTE — ED Provider Notes (Addendum)
[place] EMERGENCY DEPARTMENT AT [facility] Provider Note   CSN: [id number] Arrival date & time: 03/16/23  1217     History  Chief Complaint  Patient presents with   Finger Injury    Cory Joyce is a 67 y.o. male history of BCC, hyperlipidemia presented with laceration to his left middle finger that occurred at 11:55 AM today.  Patient states that he caught his finger in the angle grinder while doing woodwork.  Patient was wearing gloves but states that the grinder went through the glove.  Patient states he still able to move the finger but his decree sensation at the very tip of his finger.  Patient states that the lacerations on the side at the very end of his finger and denies any blood thinners.  Patient is unsure when his last tetanus was.  Patient denied complete loss of sensation, decreased motor skill  Home Medications Prior to Admission medications   Medication Sig Start Date End Date Taking? Authorizing Provider  cephALEXin (KEFLEX) 500 MG capsule Take 1 capsule (500 mg total) by mouth 4 (four) times daily. 03/16/23  Yes Cory Corrigan, PA-C  aspirin 81 MG tablet Take 81 mg by mouth daily.    [provider]  atorvastatin (LIPITOR) 40 MG tablet TAKE 1 TABLET BY MOUTH EVERY DAY 01/23/23   Cory Grandchild, MD  celecoxib (CELEBREX) 200 MG capsule TAKE 1 CAPSULE (200 MG TOTAL) BY MOUTH DAILY. TAKE WITH FOOD 02/26/23   Cory Grandchild, MD  methylphenidate (RITALIN) 10 MG tablet Take 10 mg by mouth every evening.    [provider]  Methylphenidate 25.9 MG TBED Take 2 tablets by mouth daily with breakfast.  11/25/16   [provider]  Multiple Vitamin (MULTIVITAMIN WITH MINERALS) TABS tablet Take 1 tablet by mouth daily.    [provider]  terbinafine (LAMISIL) 250 MG tablet Take 1 tablet (250 mg total) by mouth daily. 12/26/22   Cory Grandchild, MD  UNABLE TO FIND Take 2 capsules by mouth in the morning and at bedtime. Med  Name: ArteClear Blood Pressure    [provider]      Allergies    Thimerosal (thiomersal)    Review of Systems   Review of Systems See HPI Physical Exam Updated Vital Signs BP (!) 165/93 (BP Location: Right Arm)   Pulse (!) 105   Temp 98.1 F (36.7 C) (Oral)   Resp 18   Ht 5\' 10"  (1.778 m)   Wt 81.6 kg   SpO2 100%   BMI 25.83 kg/m  Physical Exam Constitutional:      General: He is not in acute distress. Cardiovascular:     Pulses: Normal pulses.  Musculoskeletal:     Comments: Step-off noted at the distal end of the left middle finger Able to range left middle finger  Skin:    General: Skin is warm and dry.     Capillary Refill: Capillary refill takes 2 to 3 seconds.     Comments: L-shaped laceration at the distal end of the left middle finger that is approximately 2 cm in length with no bony protrusion No overlying skin color changes  Neurological:     Mental Status: He is alert.     Comments: Sensation intact distally  Psychiatric:        Mood and Affect: Mood normal.     ED Results / Procedures / Treatments   Labs (all labs ordered are listed, but only  abnormal results are displayed) Labs Reviewed - No data to display  EKG None  Radiology DG Finger Middle Left  Result Date: 03/16/2023 CLINICAL DATA:  laceration EXAM: LEFT MIDDLE FINGER 2+V COMPARISON:  None Available. FINDINGS: There is a nondisplaced fracture of the distal third phalanx which extends through the tuft. No definitive intra-articular extension into the DIP. Joint alignment appears maintained. No additional fracture noted. Overlying soft tissue swelling. No unexpected radiopaque foreign body. IMPRESSION: Nondisplaced fracture of the distal third phalanx. Electronically Signed   By: Cory Joyce M.D.   On: 03/16/2023 13:43    Procedures .Marland KitchenLaceration Repair  Date/Time: 03/16/2023 2:50 PM  Performed by: Cory Corrigan, PA-C Authorized by: Cory Corrigan, PA-C   Consent:     Consent obtained:  Verbal   Consent given by:  Patient   Risks, benefits, and alternatives were discussed: yes     Risks discussed:  Infection, need for additional repair, nerve damage, pain, poor cosmetic result, poor wound healing and vascular damage Universal protocol:    Imaging studies available: yes     Patient identity confirmed:  Verbally with patient Anesthesia:    Anesthesia method:  Nerve block   Block location:  Digital   Block needle gauge:  25 G   Block anesthetic:  Lidocaine 1% w/o epi   Block technique:  Digital   Block outcome:  Anesthesia achieved Laceration details:    Location:  Finger   Finger location:  L long finger   Length (cm):  2   Depth (mm):  3 Treatment:    Area cleansed with:  Saline   Amount of cleaning:  Extensive   Irrigation solution:  Sterile saline   Irrigation volume:  100 mL   Irrigation method:  Pressure wash   Visualized foreign bodies/material removed: no     Debridement:  None Skin repair:    Repair method:  Sutures   Suture size:  5-0   Suture material:  Prolene   Number of sutures:  5 Approximation:    Approximation:  Close Repair type:    Repair type:  Intermediate Post-procedure details:    Dressing:  Antibiotic ointment and non-adherent dressing   Procedure completion:  Tolerated     Medications Ordered in ED Medications  lidocaine (PF) (XYLOCAINE) 1 % injection 5 mL (has no administration in time range)  bacitracin ointment (has no administration in time range)  Tdap (BOOSTRIX) injection 0.5 mL (0.5 mLs Intramuscular Given 03/16/23 1324)    ED Course/ Medical Decision Making/ A&P                             Medical Decision Making Amount and/or Complexity of Data Reviewed Radiology: ordered.  Risk OTC drugs. Prescription drug management.   Cory Joyce 67 y.o. presented today for a 2 cm laceration to their L middle finger. Working DDx that I considered at this time includes, but not limited to,  FB, open fracture, NV compromise, simple laceration.  R/o DDx: FB, NV compromise, simple laceration: These are considered less likely due to history of present illness and physical exam findings  Review of prior external notes: 12/21/22 Office Visit  Unique Tests and My Interpretation:  Left middle finger x-ray: Nondisplaced fracture and distal phalanx  Discussion with Independent Historian:  Wife  Discussion of Management of Tests:  [place], MD Hand Specialist  Risk: Medium: prescription drug management  Risk Stratification Score: None  Staffed with  Jeraldine Loots, MD  Plan: Patient presented for a 2 cm laceration to their L middle finger. They are neurovascularly intact. Tetanus will be updated. Patient is in no distress. Laceration will be repaired with standard wound care procedures and antibiotic ointment.  Patient's x-ray does show that patient has open fracture with this laceration.  Laceration is L-shaped at the distal end of the left middle finger approximately 2 cm in length with fatty protrusion but no bony protrusion.  Hand was consulted and stated that they would see the patient tomorrow or the day after and that patient may be discharged on antibiotics.  Patient/family educated about specific return precautions for given chief complaint and symptoms.  Patient/family educated about follow-up with PCP and hand specialist.  Patient was educated to have sutures removed in Digits, palm, and sole - 10 to 14 days.   Patient explicitly told about nondisplaced fracture and importance of hand follow up.   Laceration repaired without complication and with pulse, motor, sensation intact.  5 sutures were placed.  Patient is stable for discharge at this time. Patient/family expressed understanding of return precautions and need for follow-up. Patient spoken to regarding all imaging and laboratory results and appropriate follow up for these results. All education provided in verbal form with  additional information in written form. Time was allowed for answering of patient questions. Patient discharged.      Before patient was discharged patient called the hand Center of [facility] and stated that he was able to get an appointment.  Patient stated he would see Dasnoit, PA-C. patient requested that he has his x-ray sent via power share to this organization.  Secretary was updated of this and stated she would send the x-rays.    Final Clinical Impression(s) / ED Diagnoses Final diagnoses:  Open nondisplaced fracture of distal phalanx of left middle finger, initial encounter    Rx / DC Orders ED Discharge Orders          Ordered    cephALEXin (KEFLEX) 500 MG capsule  4 times daily        03/16/23 1444              Cory Corrigan, PA-C 03/16/23 1452    Cory Corrigan, PA-C 03/16/23 1528    Gerhard Munch, MD 03/16/23 1541

## 2023-03-17 ENCOUNTER — Telehealth: Payer: Self-pay

## 2023-03-17 NOTE — Transitions of Care (Post Inpatient/ED Visit) (Signed)
   03/17/2023  Name: ABB TWIGG MRN: [id number] DOB: 1956-07-03  Today's TOC FU Call Status: Today's TOC FU Call Status:: Successful TOC FU Call Competed TOC FU Call Complete Date: 03/17/23  Transition Care Management Follow-up Telephone Call Date of Discharge: 03/16/23 Discharge Facility: [facility]) Type of Discharge: Emergency Department Reason for ED Visit: Other: (fracture of phalanx) How have you been since you were released from the hospital?: Better Any questions or concerns?: No  Items Reviewed: Did you receive and understand the discharge instructions provided?: Yes Medications obtained,verified, and reconciled?: Yes (Medications Reviewed) Any new allergies since your discharge?: No Dietary orders reviewed?: NA Do you have support at home?: Yes People in Home: spouse  Medications Reviewed Today: Medications Reviewed Today     Reviewed by Karena Addison, LPN (Licensed Practical Nurse) on 03/17/23 at 1119  Med List Status: <None>   Medication Order Taking? Sig Documenting Provider Last Dose Status Informant  aspirin 81 MG tablet [id number] No Take 81 mg by mouth daily. [provider] Taking Active   atorvastatin (LIPITOR) 40 MG tablet [id number]  TAKE 1 TABLET BY MOUTH EVERY DAY Etta Grandchild, MD  Active   celecoxib (CELEBREX) 200 MG capsule [id number]  TAKE 1 CAPSULE (200 MG TOTAL) BY MOUTH DAILY. TAKE WITH FOOD Etta Grandchild, MD  Active   cephALEXin (KEFLEX) 500 MG capsule [id number]  Take 1 capsule (500 mg total) by mouth 4 (four) times daily. Netta Corrigan, PA-C  Active   methylphenidate (RITALIN) 10 MG tablet [id number] No Take 10 mg by mouth every evening. [provider] Taking Active   Methylphenidate 25.9 MG TBED [id number] No Take 2 tablets by mouth daily with breakfast.  [provider] Taking Active   Multiple Vitamin (MULTIVITAMIN WITH MINERALS) TABS tablet [id number] No Take 1 tablet by mouth daily. [provider] Taking Active   terbinafine (LAMISIL) 250 MG tablet [id number]  Take 1 tablet (250 mg total) by mouth daily. Etta Grandchild, MD  Active   UNABLE TO FIND [id number] No Take 2 capsules by mouth in the morning and at bedtime. Med Name: ArteClear Blood Pressure [provider] Taking Active             Home Care and Equipment/Supplies: Were Home Health Services Ordered?: NA Any new equipment or medical supplies ordered?: NA  Functional Questionnaire: Do you need assistance with bathing/showering or dressing?: No Do you need assistance with meal preparation?: No Do you need assistance with eating?: No Do you have difficulty maintaining continence: No Do you need assistance with getting out of bed/getting out of a chair/moving?: No Do you have difficulty managing or taking your medications?: No  Follow up appointments reviewed: PCP Follow-up appointment confirmed?: NA Specialist Hospital Follow-up appointment confirmed?: Yes Date of Specialist follow-up appointment?: 03/16/23 Follow-Up Specialty Provider:: hand specialist Do you need transportation to your follow-up appointment?: No Do you understand care options if your condition(s) worsen?: Yes-patient verbalized understanding    SIGNATURE Karena Addison, LPN [facility] Nurse Health Advisor Direct Dial [phone]

## 2023-04-20 ENCOUNTER — Other Ambulatory Visit: Payer: Self-pay | Admitting: Internal Medicine

## 2023-04-20 DIAGNOSIS — E785 Hyperlipidemia, unspecified: Secondary | ICD-10-CM

## 2023-04-30 NOTE — Progress Notes (Signed)
HPI M never smoker followed for OSA, complicated by Hearing loss, ADHD, HTN,  HST 19/29/17- AHI 15/ hr, desaturation to 77%, body weight 188 lb ================================================= 04/29/22- 65 yoM never smoker followed for OSA, complicated by Hearing loss, ADHD, HTN,   -Ritalin 10 mg 1 each evening, methylphenidate 25.9 mg 2 daily CPAP 9/ [facility]   order replacement 04/24/22 Referral to Dr Myrtis Ser to consider OAP 04/24/21 with change to auto 5-15? Not done Body weight today-186 lbs Download compliance 97%, AHI 1.6/ hr -----Pt f/u no issues w/ CPAP currently, in the process on working on another orthodontic device. Getting approx 6hrs/night w/ CPAP.  He is hanging on to old CPAP machine while OAP is adjusted.  05/02/23- 66 yoM never smoker followed for OSA, complicated by Hearing loss, ADHD, HTN,   -Ritalin 10 mg 1 each evening, methylphenidate 25.9 mg 2 daily CPAP 9/ [facility]   order replacement 04/24/22 Referral to Dr Myrtis Ser to consider OAP 04/24/21 with change to auto 5-15 Not done Body weight today-188 lbs Download compliance 93%, AHI 2.6/ hr Download reviewed. Using oral appliance from Dr Myrtis Ser as well as his old CPAP machine still set on 9. He will check with his insurance and also with D[facility] about replacing old CPAP machine, since insurance recently paid for his OAP.  ROS- + = positive Constitutional:    weight loss, night sweats, fevers, chills, fatigue, lassitude. HEENT:    headaches, difficulty swallowing, tooth/dental problems, sore throat,       sneezing, itching, ear ache, nasal congestion, post nasal drip, snoring, + hearing loss, CV:    chest pain, orthopnea, PND, swelling in lower extremities, anasarca,                                   dizziness, palpitations Resp:   shortness of breath with exertion or at rest.                productive cough,   non-productive cough, coughing up of blood.              change in color of mucus.  wheezing.   Skin:    rash or lesions. GI:   No-   heartburn, indigestion, abdominal pain, nausea, vomiting, diarrhea,                 change in bowel habits, loss of appetite GU: dysuria, change in color of urine, no urgency or frequency.   flank pain. MS:   joint pain, stiffness, decreased range of motion, back pain. Neuro-     +ADHD- treated Psych:  change in mood or affect.  depression or anxiety.   memory loss.  OBJ- Physical Exam General- Alert, Oriented, Affect-appropriate, Distress- none acute, not obese Skin- rash-none, lesions- none, excoriation- none Lymphadenopathy- none Head- atraumatic            Eyes- Gross vision intact, PERRLA, conjunctivae and secretions clear            Ears- Hearing, canals-normal            Nose- Clear, no-Septal dev, mucus, polyps, erosion, perforation             Throat- Mallampati II , mucosa clear , drainage- none, tonsils- atrophic, + teeth Neck- flexible , trachea midline, no stridor , thyroid nl, carotid no bruit Chest - symmetrical excursion , unlabored  Heart/CV- RRR , no murmur , no gallop  , no rub, nl s1 s2                           - JVD- none , edema- none, stasis changes- none, varices- none           Lung- clear to P&A, wheeze- none, cough- none , dullness-none, rub- none           Chest wall-  Abd-  Br/ Gen/ Rectal- Not done, not indicated Extrem- cyanosis- none, clubbing, none, atrophy- none, strength- nl Neuro- grossly intact to observation

## 2023-05-02 ENCOUNTER — Ambulatory Visit (INDEPENDENT_AMBULATORY_CARE_PROVIDER_SITE_OTHER): Payer: BC Managed Care – PPO | Admitting: Internal Medicine

## 2023-05-02 ENCOUNTER — Encounter: Payer: Self-pay | Admitting: Internal Medicine

## 2023-05-02 VITALS — BP 132/66 | HR 84 | Ht 70.0 in | Wt 188.0 lb

## 2023-05-02 DIAGNOSIS — F909 Attention-deficit hyperactivity disorder, unspecified type: Secondary | ICD-10-CM

## 2023-05-02 DIAGNOSIS — G4733 Obstructive sleep apnea (adult) (pediatric): Secondary | ICD-10-CM | POA: Diagnosis not present

## 2023-05-02 NOTE — Patient Instructions (Signed)
Order- D[facility]- please replace old CPAP machine. Change to auto 5-15, mask of choice, humidifier, supplies, AirView/ card  Please call if we can help

## 2023-05-12 ENCOUNTER — Encounter (INDEPENDENT_AMBULATORY_CARE_PROVIDER_SITE_OTHER): Payer: Self-pay

## 2023-05-18 ENCOUNTER — Encounter: Payer: Self-pay | Admitting: Internal Medicine

## 2023-05-18 NOTE — Assessment & Plan Note (Addendum)
He is using both CPAP and OAP from Dr Myrtis Ser. He will check on insurance coverage for replacement machine, with change to auto 5-15, since insurance recently paid for OAP. He does feel he benefits from CPAP.

## 2023-05-18 NOTE — Assessment & Plan Note (Signed)
He continues methylphendiate for this.

## 2023-05-25 ENCOUNTER — Encounter: Payer: Self-pay | Admitting: Internal Medicine

## 2023-06-21 ENCOUNTER — Ambulatory Visit: Payer: BC Managed Care – PPO | Admitting: Internal Medicine

## 2023-06-21 VITALS — BP 134/80 | HR 86 | Temp 98.0°F | Ht 70.0 in | Wt 187.0 lb

## 2023-06-21 DIAGNOSIS — E785 Hyperlipidemia, unspecified: Secondary | ICD-10-CM | POA: Diagnosis not present

## 2023-06-21 DIAGNOSIS — I10 Essential (primary) hypertension: Secondary | ICD-10-CM | POA: Diagnosis not present

## 2023-06-21 DIAGNOSIS — B351 Tinea unguium: Secondary | ICD-10-CM

## 2023-06-21 LAB — CBC WITH DIFFERENTIAL/PLATELET
Basophils Absolute: 0 10*3/uL (ref 0.0–0.1)
Basophils Relative: 0.8 % (ref 0.0–3.0)
Eosinophils Absolute: 0.3 10*3/uL (ref 0.0–0.7)
Eosinophils Relative: 5.3 % — ABNORMAL HIGH (ref 0.0–5.0)
HCT: 44 % (ref 39.0–52.0)
Hemoglobin: 14.9 g/dL (ref 13.0–17.0)
Lymphocytes Relative: 33.4 % (ref 12.0–46.0)
Lymphs Abs: 2 10*3/uL (ref 0.7–4.0)
MCHC: 33.9 g/dL (ref 30.0–36.0)
MCV: 85.9 fl (ref 78.0–100.0)
Monocytes Absolute: 0.7 10*3/uL (ref 0.1–1.0)
Monocytes Relative: 12.1 % — ABNORMAL HIGH (ref 3.0–12.0)
Neutro Abs: 2.9 10*3/uL (ref 1.4–7.7)
Neutrophils Relative %: 48.4 % (ref 43.0–77.0)
Platelets: 269 10*3/uL (ref 150.0–400.0)
RBC: 5.13 Mil/uL (ref 4.22–5.81)
RDW: 14.8 % (ref 11.5–15.5)
WBC: 6.1 10*3/uL (ref 4.0–10.5)

## 2023-06-21 LAB — BASIC METABOLIC PANEL
BUN: 19 mg/dL (ref 6–23)
CO2: 30 mEq/L (ref 19–32)
Calcium: 9.5 mg/dL (ref 8.4–10.5)
Chloride: 102 mEq/L (ref 96–112)
Creatinine, Ser: 1.08 mg/dL (ref 0.40–1.50)
GFR: 71.9 mL/min (ref >60.00)
Glucose, Bld: 113 mg/dL — ABNORMAL HIGH (ref 70–99)
Potassium: 3.7 mEq/L (ref 3.5–5.1)
Sodium: 139 mEq/L (ref 135–145)

## 2023-06-21 LAB — HEPATIC FUNCTION PANEL
ALT: 22 U/L (ref 0–53)
AST: 21 U/L (ref 0–37)
Albumin: 4.4 g/dL (ref 3.5–5.2)
Alkaline Phosphatase: 81 U/L (ref 39–117)
Bilirubin, Direct: 0.1 mg/dL (ref 0.0–0.3)
Total Bilirubin: 0.6 mg/dL (ref 0.2–1.2)
Total Protein: 7 g/dL (ref 6.0–8.3)

## 2023-06-21 NOTE — Progress Notes (Unsigned)
Subjective:  Patient ID: Cory Joyce, male    DOB: July 16, 1956  Age: 67 y.o. MRN: [id number]  CC: Hyperlipidemia and Hypertension   HPI Cory Joyce presents for f/up -  Discussed the use of AI scribe software for clinical note transcription with the patient, who gave verbal consent to proceed.  History of Present Illness   The patient presents with a chief complaint of persistent toenail fungus, which has not improved despite a 90-day course of Terbinafine prescribed six months ago. In fact, the patient reports that the fungal infection appears to have spread. Additionally, the patient experiences occasional soreness on the side of the affected toenail.       Outpatient Medications Prior to Visit  Medication Sig Dispense Refill   aspirin 81 MG tablet Take 81 mg by mouth daily.     celecoxib (CELEBREX) 200 MG capsule TAKE 1 CAPSULE (200 MG TOTAL) BY MOUTH DAILY. TAKE WITH FOOD 90 capsule 1   methylphenidate (RITALIN) 10 MG tablet Take 10 mg by mouth every evening.     Methylphenidate 25.9 MG TBED Take 2 tablets by mouth daily with breakfast.      Multiple Vitamin (MULTIVITAMIN WITH MINERALS) TABS tablet Take 1 tablet by mouth daily.     UNABLE TO FIND Take 2 capsules by mouth in the morning and at bedtime. Med Name: ArteClear Blood Pressure     atorvastatin (LIPITOR) 40 MG tablet TAKE 1 TABLET BY MOUTH EVERY DAY 90 tablet 0   No facility-administered medications prior to visit.    ROS Review of Systems  Constitutional: Negative.  Negative for diaphoresis and fatigue.  HENT: Negative.    Eyes: Negative.  Negative for visual disturbance.  Respiratory: Negative.  Negative for chest tightness, shortness of breath and wheezing.   Cardiovascular:  Negative for chest pain, palpitations and leg swelling.  Gastrointestinal:  Negative for abdominal pain, constipation, diarrhea, nausea and vomiting.  Endocrine: Negative.   Genitourinary: Negative.  Negative for  difficulty urinating.  Musculoskeletal: Negative.  Negative for arthralgias and myalgias.  Skin: Negative.  Negative for color change and pallor.  Neurological:  Negative for dizziness and weakness.  Hematological:  Negative for adenopathy. Does not bruise/bleed easily.  Psychiatric/Behavioral: Negative.      Objective:  BP 134/80 (BP Location: Left Arm, Patient Position: Sitting, Cuff Size: Large)   Pulse 86   Temp 98 F (36.7 C) (Oral)   Ht 5\' 10"  (1.778 m)   Wt 187 lb (84.8 kg)   SpO2 97%   BMI 26.83 kg/m   BP Readings from Last 3 Encounters:  06/21/23 134/80  05/02/23 132/66  03/16/23 (!) 165/93    Wt Readings from Last 3 Encounters:  06/21/23 187 lb (84.8 kg)  05/02/23 188 lb (85.3 kg)  03/16/23 180 lb (81.6 kg)    Physical Exam Vitals reviewed.  HENT:     Nose: Nose normal.     Mouth/Throat:     Mouth: Mucous membranes are moist.  Eyes:     General: No scleral icterus.    Conjunctiva/sclera: Conjunctivae normal.  Cardiovascular:     Rate and Rhythm: Normal rate and regular rhythm.     Heart sounds: No murmur heard. Pulmonary:     Effort: Pulmonary effort is normal.     Breath sounds: No stridor. No wheezing, rhonchi or rales.  Abdominal:     General: Abdomen is flat.     Palpations: There is no mass.     Tenderness: There is  no abdominal tenderness. There is no guarding.     Hernia: No hernia is present.  Musculoskeletal:        General: Normal range of motion.     Cervical back: Neck supple.     Right lower leg: No edema.     Left lower leg: No edema.  Lymphadenopathy:     Cervical: No cervical adenopathy.  Skin:    General: Skin is warm and dry.     Findings: No rash.  Neurological:     General: No focal deficit present.     Mental Status: He is alert. Mental status is at baseline.  Psychiatric:        Mood and Affect: Mood normal.        Behavior: Behavior normal.     Lab Results  Component Value Date   WBC 6.1 06/21/2023   HGB 14.9  06/21/2023   HCT 44.0 06/21/2023   PLT 269.0 06/21/2023   GLUCOSE 113 (H) 06/21/2023   CHOL 130 12/16/2021   TRIG 86.0 12/16/2021   HDL 49.10 12/16/2021   LDLDIRECT 136.1 09/11/2010   LDLCALC 64 12/16/2021   ALT 22 06/21/2023   AST 21 06/21/2023   NA 139 06/21/2023   K 3.7 06/21/2023   CL 102 06/21/2023   CREATININE 1.08 06/21/2023   BUN 19 06/21/2023   CO2 30 06/21/2023   TSH 1.46 12/21/2022   PSA 1.03 12/21/2022   HGBA1C 5.8 08/05/2020           DG Finger Middle Left  Result Date: 03/16/2023 CLINICAL DATA:  laceration EXAM: LEFT MIDDLE FINGER 2+V COMPARISON:  None Available. FINDINGS: There is a nondisplaced fracture of the distal third phalanx which extends through the tuft. No definitive intra-articular extension into the DIP. Joint alignment appears maintained. No additional fracture noted. Overlying soft tissue swelling. No unexpected radiopaque foreign body. IMPRESSION: Nondisplaced fracture of the distal third phalanx. Electronically Signed   By: Meda Klinefelter M.D.   On: 03/16/2023 13:43    Assessment & Plan:   Superficial white onychomycosis - LFT's are normal. -     Hepatic function panel; Future -     Ambulatory referral to Podiatry  Primary hypertension - His BP is well controlled. -     Basic metabolic panel; Future -     CBC with Differential/Platelet; Future  Hyperlipidemia with target LDL less than 130 - LDL goal achieved. Doing well on the statin  -     Hepatic function panel; Future -     Atorvastatin Calcium; Take 1 tablet (40 mg total) by mouth daily.  Dispense: 90 tablet; Refill: 0     Follow-up: Return in about 6 months (around 12/19/2023).  Sanda Linger, MD

## 2023-06-21 NOTE — Patient Instructions (Signed)

## 2023-06-23 MED ORDER — ATORVASTATIN CALCIUM 40 MG PO TABS: 40.0000 mg | ORAL_TABLET | Freq: Every day | ORAL | 0 refills | Status: DC

## 2023-07-04 ENCOUNTER — Ambulatory Visit (INDEPENDENT_AMBULATORY_CARE_PROVIDER_SITE_OTHER): Payer: BC Managed Care – PPO | Admitting: Podiatry

## 2023-07-04 ENCOUNTER — Encounter: Payer: Self-pay | Admitting: Podiatry

## 2023-07-04 DIAGNOSIS — B351 Tinea unguium: Secondary | ICD-10-CM

## 2023-07-04 DIAGNOSIS — L600 Ingrowing nail: Secondary | ICD-10-CM | POA: Diagnosis not present

## 2023-07-06 ENCOUNTER — Telehealth: Payer: BC Managed Care – PPO | Admitting: Nurse Practitioner

## 2023-07-06 DIAGNOSIS — R42 Dizziness and giddiness: Secondary | ICD-10-CM

## 2023-07-06 DIAGNOSIS — R55 Syncope and collapse: Secondary | ICD-10-CM

## 2023-07-06 NOTE — Progress Notes (Signed)
Subjective:   Patient ID: Cory Joyce, male   DOB: 67 y.o.   MRN: [id number]   HPI Patient presents stating he has had a lot of discoloration of his right big toenail concerned about fungus and it also been painful in the corner and he has several other nails with discoloration to.  States has been this way for the last few months and pain for a significant period of time.  Patient does not smoke likes to be active   Review of Systems  All other systems reviewed and are negative.       Objective:  Physical Exam Vitals and nursing note reviewed.  Constitutional:      Appearance: He is well-developed.  Pulmonary:     Effort: Pulmonary effort is normal.  Musculoskeletal:        General: Normal range of motion.  Skin:    General: Skin is warm.  Neurological:     Mental Status: He is alert.     Neurovascular status intact muscle strength adequate range of motion within normal limits with patient noted to have a discolored right hallux medial border painful when pressed with no drainage no redness noted.  Several other nails have a mild degree of thickness associated with them and patient has been on terbinafine for 3 months previously     Assessment:  Possibility for fungal infection versus possibility for trauma with ingrown toenail deformity right hallux medial border     Plan:  H&P reviewed and I have recommended correction of deformity and sending this off for fungal evaluation.  Patient wants this done I explained that this will serve 2 purposes and today I went ahead and I allowed him to sign consent form after discussing risk.  I infiltrated the right big toe 60 mg like Marcaine mixture sterile prep done and using sterile instrumentation remove the medial border exposed matrix applied phenol 3 applications 30 seconds followed by alcohol lavage sterile dressing gave instructions on soaks and to wear dressing 24 hours take it off earlier if throbbing were to occur and  encouraged to call with questions concerns.  I did send this off for the evaluation

## 2023-07-06 NOTE — Progress Notes (Signed)
Because of what you experienced today, I feel your condition warrants further evaluation and I recommend that you be seen in a face to face visit.   This does not seem consistent with flu as you did not develop a fever, and since symptoms have passed.  There are several explanations, but in order to assess what did happen you will need to be seen in person for a workup.  I do not recommend waiting as these may be warning signs for stroke. I would recommend Urgent Care if not ER today for evaluation   NOTE: There will be NO CHARGE for this eVisit   If you are having a true medical emergency please call 911.      For an urgent face to face visit, [place] [facility] has eight urgent care centers for your convenience:   NEW!! [facility] Health Urgent Care Center at [facility] Get Driving Directions [phone] [address], [address] [place], [address]    [facility] Health Urgent Care Center at [facility] Get Driving Directions [phone] [address] [place], [place] [address]   [facility] Health Urgent Care Center [facility]) Get Driving Directions [phone] [address] [place], [place] [address]  [facility] Health Urgent Care Center [facility]) Get Driving Directions [phone] [address] [place],  [place]  [address]  [facility] Health Urgent Care Center [facility] - at [employer]  [phone] [address] [place],  [place] [address]   [facility] Health Urgent Care at [facility] Get Driving Directions [phone] [place], [place] [address]   [facility] Health Urgent Care at [facility] Get Driving Directions  [phone] [address] [place], [place] [address]   [facility] Health Urgent Care at [facility] Directions [phone] [address] [place], [place] [address]  Your MyChart E-visit questionnaire answers were reviewed  by a board certified advanced clinical practitioner to complete your personal care plan based on your specific symptoms.  Thank you for using e-Visits.

## 2023-07-08 ENCOUNTER — Ambulatory Visit (HOSPITAL_COMMUNITY)
Admission: RE | Admit: 2023-07-08 | Discharge: 2023-07-08 | Disposition: A | Discharge status: Home / Self Care | Payer: BC Managed Care – PPO | Source: Ambulatory Visit | Attending: Emergency Medicine | Admitting: Emergency Medicine

## 2023-07-08 ENCOUNTER — Encounter (HOSPITAL_COMMUNITY): Payer: Self-pay

## 2023-07-08 VITALS — BP 159/98 | HR 76 | Temp 97.5°F | Resp 16

## 2023-07-08 DIAGNOSIS — R42 Dizziness and giddiness: Secondary | ICD-10-CM | POA: Insufficient documentation

## 2023-07-08 DIAGNOSIS — R55 Syncope and collapse: Secondary | ICD-10-CM | POA: Diagnosis present

## 2023-07-08 LAB — CBC
HCT: 44.6 % (ref 39.0–52.0)
Hemoglobin: 15.3 g/dL (ref 13.0–17.0)
MCH: 28.6 pg (ref 26.0–34.0)
MCHC: 34.3 g/dL (ref 30.0–36.0)
MCV: 83.4 fL (ref 80.0–100.0)
Platelets: 308 10*3/uL (ref 150–400)
RBC: 5.35 MIL/uL (ref 4.22–5.81)
RDW: 14.3 % (ref 11.5–15.5)
WBC: 6 10*3/uL (ref 4.0–10.5)
nRBC: 0 % (ref 0.0–0.2)

## 2023-07-08 LAB — COMPREHENSIVE METABOLIC PANEL
ALT: 24 U/L (ref 0–44)
AST: 22 U/L (ref 15–41)
Albumin: 4.1 g/dL (ref 3.5–5.0)
Alkaline Phosphatase: 75 U/L (ref 38–126)
Anion gap: 12 (ref 5–15)
BUN: 16 mg/dL (ref 8–23)
CO2: 29 mmol/L (ref 22–32)
Calcium: 9.7 mg/dL (ref 8.9–10.3)
Chloride: 99 mmol/L (ref 98–111)
Creatinine, Ser: 1.01 mg/dL (ref 0.61–1.24)
GFR, Estimated: >60 mL/min (ref >60)
Glucose, Bld: 135 mg/dL — ABNORMAL HIGH (ref 70–99)
Potassium: 3.8 mmol/L (ref 3.5–5.1)
Sodium: 140 mmol/L (ref 135–145)
Total Bilirubin: 0.9 mg/dL (ref 0.3–1.2)
Total Protein: 7.1 g/dL (ref 6.5–8.1)

## 2023-07-08 NOTE — Discharge Instructions (Addendum)
Your blood work results will return today and I will call you if there is anything concerning.  Otherwise I recommend following up with primary care regarding today's visit and elevated blood pressure.  Return here if your symptoms persist.  If you develop severe headache, blurry vision, numbness, tingling, weakness, passing out again, chest pain, shortness of breath, or severely elevated blood pressure please seek immediate medical treatment in the ER.

## 2023-07-08 NOTE — ED Triage Notes (Signed)
Patient states that he got up at 0600 2 days go and felt dizzy walking down his steps. Patient states he felt like he was going to pass out, became sweaty, and felt off while sitting on his porch. Patient states when he came to he had vomited on himself. Patient states he went back to bed and slept x 6 hours and when he woke he felt much better, but did an e-visit. Patient states he read the e-visit note which said he needed to be seen face-to-face.  Patient denies any feeling of dizziness or near syncope today.

## 2023-07-08 NOTE — ED Provider Notes (Signed)
[facility]    CSN: [id number] Arrival date & time: 07/08/23  1336      History   Chief Complaint Chief Complaint  Patient presents with   Dizziness    This is based on the recommendation from my e-visit on Wednesday,  October 9th. Refer to [employer] and documentation. - Entered by patient   Loss of Consciousness    HPI Cory Joyce is a 67 y.o. male.   Patient presents 2 days after episode of dizziness and syncopal episode.  Patient states he began feeling dizzy and sweaty so he sat down.  Patient states he blacked out and when he came to he realized he vomited on himself.  Patient states afterwards he felt very weak and laid in bed and fell asleep for 6 hours.  Denies confusion, weakness, numbness, tingling, blurred vision, headache, and chest pain.  Denies any symptoms today or yesterday.  Patient had an e-visit, but did not see the follow-up note about being further evaluated in urgent care or ER until yesterday.   Dizziness Associated symptoms: nausea, syncope, vomiting and weakness   Associated symptoms: no chest pain, no diarrhea, no headaches and no shortness of breath   Loss of Consciousness Associated symptoms: diaphoresis, dizziness, nausea, vomiting and weakness   Associated symptoms: no chest pain, no confusion, no fever, no headaches, no seizures and no shortness of breath     Past Medical History:  Diagnosis Date   ADHD (attention deficit hyperactivity disorder)    BCC (basal cell carcinoma), arm    left forearm 10 years ago   Hyperlipidemia    borderline but no treatment   Personal history of colonic polyps 09/22/2010   Right clavicle fracture Jan 2011   Sleep apnea    uses c pap    Patient Active Problem List   Diagnosis Date Noted   Superficial white onychomycosis 12/21/2022   Primary hypertension 04/23/2022   Bilateral high frequency sensorineural hearing loss 10/03/2017   OSA (obstructive sleep apnea) 12/29/2016    Attention deficit hyperactivity disorder (ADHD) 12/29/2016   BCC (basal cell carcinoma of skin) 12/01/2015   Hyperlipidemia with target LDL less than 130 12/01/2015   Routine general medical examination at a health care facility 07/01/2014   History of colonic polyps 09/22/2010    Past Surgical History:  Procedure Laterality Date   COLONOSCOPY  09-22-2010   POLYPECTOMY         Home Medications    Prior to Admission medications   Medication Sig Start Date End Date Taking? Authorizing Provider  aspirin 81 MG tablet Take 81 mg by mouth daily.    [provider]  atorvastatin (LIPITOR) 40 MG tablet Take 1 tablet (40 mg total) by mouth daily. 06/23/23   Etta Grandchild, MD  celecoxib (CELEBREX) 200 MG capsule TAKE 1 CAPSULE (200 MG TOTAL) BY MOUTH DAILY. TAKE WITH FOOD 02/26/23   Etta Grandchild, MD  methylphenidate (RITALIN) 10 MG tablet Take 10 mg by mouth every evening.    [provider]  Methylphenidate 25.9 MG TBED Take 1 tablet by mouth daily with breakfast. 11/25/16   [provider]  Multiple Vitamin (MULTIVITAMIN WITH MINERALS) TABS tablet Take 1 tablet by mouth daily.    [provider]  UNABLE TO FIND Take 2 capsules by mouth in the morning and at bedtime. Med Name: ArteClear Blood Pressure    [provider]    Family History Family History  Problem Relation Age of  Onset   Prostate cancer Father    Alcohol abuse Father    Colon polyps Father    Cancer Neg Hx    Diabetes Neg Hx    Drug abuse Neg Hx    Early death Neg Hx    Heart disease Neg Hx    Hyperlipidemia Neg Hx    Hypertension Neg Hx    Kidney disease Neg Hx    Stroke Neg Hx    Colon cancer Neg Hx    Esophageal cancer Neg Hx    Rectal cancer Neg Hx    Stomach cancer Neg Hx     Social History Social History   Tobacco Use   Smoking status: Never   Smokeless tobacco: Never  Vaping Use   Vaping status: Never Used  Substance Use Topics   Alcohol use: Yes     Alcohol/week: 14.0 standard drinks of alcohol    Types: 14 Cans of beer per week    Comment: several a week    Drug use: No     Allergies   Thimerosal (thiomersal)   Review of Systems Review of Systems  Constitutional:  Positive for diaphoresis and fatigue. Negative for chills and fever.  HENT:  Negative for congestion.   Eyes:  Negative for photophobia and visual disturbance.  Respiratory:  Negative for chest tightness and shortness of breath.   Cardiovascular:  Positive for syncope. Negative for chest pain.  Gastrointestinal:  Positive for nausea and vomiting. Negative for abdominal pain and diarrhea.  Neurological:  Positive for dizziness, syncope, weakness and light-headedness. Negative for tremors, seizures, facial asymmetry, speech difficulty, numbness and headaches.  Psychiatric/Behavioral:  Negative for confusion.      Physical Exam Triage Vital Signs ED Triage Vitals  Encounter Vitals Group     BP 07/08/23 1347 (!) 159/98     Systolic BP Percentile --      Diastolic BP Percentile --      Pulse Rate 07/08/23 1347 76     Resp 07/08/23 1347 16     Temp 07/08/23 1347 (!) 97.5 F (36.4 C)     Temp Source 07/08/23 1347 Oral     SpO2 07/08/23 1347 98 %     Weight --      Height --      Head Circumference --      Peak Flow --      Pain Score 07/08/23 1350 0     Pain Loc --      Pain Education --      Exclude from Growth Chart --    No data found.  Updated Vital Signs BP (!) 159/98 (BP Location: Right Arm)   Pulse 76   Temp (!) 97.5 F (36.4 C) (Oral)   Resp 16   SpO2 98%   Visual Acuity Right Eye Distance:   Left Eye Distance:   Bilateral Distance:    Right Eye Near:   Left Eye Near:    Bilateral Near:     Physical Exam Vitals and nursing note reviewed.  Constitutional:      General: He is awake. He is not in acute distress.    Appearance: Normal appearance. He is well-developed and well-groomed. He is not ill-appearing, toxic-appearing or  diaphoretic.  HENT:     Right Ear: Tympanic membrane, ear canal and external ear normal.     Left Ear: Tympanic membrane, ear canal and external ear normal.     Nose: Nose normal.     Right  Sinus: No maxillary sinus tenderness or frontal sinus tenderness.     Left Sinus: No maxillary sinus tenderness or frontal sinus tenderness.     Mouth/Throat:     Mouth: Mucous membranes are moist.     Pharynx: Oropharynx is clear.  Eyes:     Extraocular Movements: Extraocular movements intact.     Pupils: Pupils are equal, round, and reactive to light.  Cardiovascular:     Rate and Rhythm: Normal rate.     Heart sounds: Normal heart sounds.  Pulmonary:     Effort: Pulmonary effort is normal.     Breath sounds: Normal breath sounds.  Musculoskeletal:        General: Normal range of motion.     Cervical back: Normal range of motion and neck supple.  Skin:    General: Skin is warm and dry.  Neurological:     General: No focal deficit present.     Mental Status: He is alert and oriented to person, place, and time.     GCS: GCS eye subscore is 4. GCS verbal subscore is 5. GCS motor subscore is 6.     Cranial Nerves: Cranial nerves 2-12 are intact.     Sensory: Sensation is intact.     Motor: Motor function is intact.     Coordination: Coordination is intact.     Gait: Gait is intact.  Psychiatric:        Attention and Perception: Attention and perception normal.        Mood and Affect: Mood and affect normal.        Speech: Speech normal.        Behavior: Behavior normal. Behavior is cooperative.        Thought Content: Thought content normal.        Cognition and Memory: Cognition and memory normal.        Judgment: Judgment normal.      UC Treatments / Results  Labs (all labs ordered are listed, but only abnormal results are displayed) Labs Reviewed  CBC  COMPREHENSIVE METABOLIC PANEL    EKG   Radiology No results found.  Procedures Procedures (including critical care  time)  Medications Ordered in UC Medications - No data to display  Initial Impression / Assessment and Plan / UC Course  I have reviewed the triage vital signs and the nursing notes.  Pertinent labs & imaging results that were available during my care of the patient were reviewed by me and considered in my medical decision making (see chart for details).     Patient presented for evaluation after an episode of dizziness and syncopal episode 2 days ago.  Patient reports he became very diaphoretic and dizzy so he sat down and then blacked out.  Upon waking he realized he vomited on himself.  Patient states he believes he was out for about 2 minutes, and upon waking was aware of his surroundings.  Patient states he felt very fatigued after he went laid in bed and fell asleep for 6 hours.  No significant findings upon assessment. No neurodeficits noted. EOMI and PERRLA. GCS 15.  EKG revealed normal sinus rhythm.  Blood pressure is elevated at 159/98. Patient reports taking his blood pressure at home on the day of the incident, his blood pressure was 128/84. Patient denies ever taking medication for hypertension.  Patient denies any symptoms at all today.  Ordered CBC and CMP to rule out electrolyte imbalance.  Discussed follow-up, return, strict ER precautions.  Final Clinical Impressions(s) / UC Diagnoses   Final diagnoses:  Dizziness  Syncope, unspecified syncope type     Discharge Instructions      Your blood work results will return today and I will call you if there is anything concerning.  Otherwise I recommend following up with primary care regarding today's visit and elevated blood pressure.  Return here if your symptoms persist.  If you develop severe headache, blurry vision, numbness, tingling, weakness, passing out again, chest pain, shortness of breath, or severely elevated blood pressure please seek immediate medical treatment in the ER.    ED Prescriptions   None    PDMP  not reviewed this encounter.   Wynonia Lawman A, NP 07/08/23 1537

## 2023-07-09 ENCOUNTER — Encounter: Payer: Self-pay | Admitting: Internal Medicine

## 2023-07-14 DIAGNOSIS — S6992XA Unspecified injury of left wrist, hand and finger(s), initial encounter: Secondary | ICD-10-CM | POA: Insufficient documentation

## 2023-08-04 ENCOUNTER — Other Ambulatory Visit: Payer: Self-pay | Admitting: Podiatry

## 2023-08-15 ENCOUNTER — Ambulatory Visit: Payer: BC Managed Care – PPO | Admitting: Podiatry

## 2023-08-17 ENCOUNTER — Ambulatory Visit: Payer: BC Managed Care – PPO | Admitting: Internal Medicine

## 2023-08-17 ENCOUNTER — Ambulatory Visit (INDEPENDENT_AMBULATORY_CARE_PROVIDER_SITE_OTHER): Payer: BC Managed Care – PPO | Admitting: Podiatry

## 2023-08-17 ENCOUNTER — Encounter: Payer: Self-pay | Admitting: Podiatry

## 2023-08-17 ENCOUNTER — Encounter: Payer: Self-pay | Admitting: Internal Medicine

## 2023-08-17 VITALS — BP 144/86 | HR 73 | Temp 97.6°F | Ht 70.0 in | Wt 187.2 lb

## 2023-08-17 DIAGNOSIS — B351 Tinea unguium: Secondary | ICD-10-CM | POA: Diagnosis not present

## 2023-08-17 DIAGNOSIS — I10 Essential (primary) hypertension: Secondary | ICD-10-CM

## 2023-08-17 DIAGNOSIS — E785 Hyperlipidemia, unspecified: Secondary | ICD-10-CM | POA: Diagnosis not present

## 2023-08-17 DIAGNOSIS — R739 Hyperglycemia, unspecified: Secondary | ICD-10-CM | POA: Diagnosis not present

## 2023-08-17 LAB — BASIC METABOLIC PANEL
BUN: 16 mg/dL (ref 6–23)
CO2: 33 meq/L — ABNORMAL HIGH (ref 19–32)
Calcium: 10.1 mg/dL (ref 8.4–10.5)
Chloride: 103 meq/L (ref 96–112)
Creatinine, Ser: 1.13 mg/dL (ref 0.40–1.50)
GFR: 68.02 mL/min (ref >60.00)
Glucose, Bld: 93 mg/dL (ref 70–99)
Potassium: 4.4 meq/L (ref 3.5–5.1)
Sodium: 141 meq/L (ref 135–145)

## 2023-08-17 LAB — LIPID PANEL
Cholesterol: 129 mg/dL (ref 0–200)
HDL: 42.8 mg/dL (ref >39.00)
LDL Cholesterol: 68 mg/dL (ref 0–99)
NonHDL: 86.31
Total CHOL/HDL Ratio: 3
Triglycerides: 90 mg/dL (ref 0.0–149.0)
VLDL: 18 mg/dL (ref 0.0–40.0)

## 2023-08-17 LAB — HEMOGLOBIN A1C: Hgb A1c MFr Bld: 6 % (ref 4.6–6.5)

## 2023-08-17 MED ORDER — INDAPAMIDE 1.25 MG PO TABS
1.2500 mg | ORAL_TABLET | Freq: Every day | ORAL | 1 refills | Status: DC
Start: 2023-08-17 — End: 2023-12-26

## 2023-08-17 NOTE — Progress Notes (Signed)
Subjective:  Patient ID: Cory Joyce, male    DOB: [birth year]  Age: 67 y.o. MRN: [id number]  CC: Hypertension   HPI PAYNE TESKEY presents for f/up ---  Discussed the use of AI scribe software for clinical note transcription with the patient, who gave verbal consent to proceed.  History of Present Illness   The patient, with a history of hypertension and ADHD, presented with an episode of dizziness and vomiting approximately six weeks ago. The episode occurred early in the morning when he was descending the stairs to feed his dogs. He reported feeling unwell and dizzy, which led to a brief loss of consciousness and subsequent vomiting. After the episode, he felt significantly better and initially attributed the symptoms to a possible flu or food poisoning. However, he decided to seek medical attention due to the unusual nature of the episode.  Since the episode, the patient has not experienced any recurrent symptoms such as headaches, blurred vision, numbness, weakness, tingling, or slurred speech. He has not noticed any swelling in his legs or feet. He has been monitoring his blood pressure regularly, noting that it is usually within normal limits in the morning but tends to rise to the 130s or 140s systolic after breakfast and coffee.  He has not started any new medications recently. He has been concerned about his blood pressure readings, particularly the systolic readings in the 130-160 range. He is open to starting antihypertensive medication if necessary.       Outpatient Medications Prior to Visit  Medication Sig Dispense Refill   aspirin 81 MG tablet Take 81 mg by mouth daily.     atorvastatin (LIPITOR) 40 MG tablet Take 1 tablet (40 mg total) by mouth daily. 90 tablet 0   celecoxib (CELEBREX) 200 MG capsule TAKE 1 CAPSULE (200 MG TOTAL) BY MOUTH DAILY. TAKE WITH FOOD 90 capsule 1   fexofenadine (ALLEGRA) 180 MG tablet Take 180 mg by mouth daily.      methylphenidate (RITALIN) 10 MG tablet Take 10 mg by mouth every evening.     Methylphenidate 25.9 MG TBED Take 1 tablet by mouth daily with breakfast.     Multiple Vitamin (MULTIVITAMIN WITH MINERALS) TABS tablet Take 1 tablet by mouth daily.     UNABLE TO FIND Take 2 capsules by mouth in the morning and at bedtime. Med Name: ArteClear Blood Pressure     No facility-administered medications prior to visit.    ROS Review of Systems  Constitutional:  Negative for diaphoresis and fatigue.  HENT: Negative.    Eyes:  Negative for visual disturbance.  Respiratory:  Negative for cough, chest tightness, shortness of breath and wheezing.   Cardiovascular:  Negative for chest pain, palpitations and leg swelling.  Gastrointestinal:  Negative for abdominal pain, constipation, diarrhea, nausea and vomiting.  Endocrine: Negative.   Genitourinary: Negative.  Negative for difficulty urinating and dysuria.  Musculoskeletal: Negative.  Negative for arthralgias, joint swelling and myalgias.  Skin: Negative.   Neurological:  Negative for dizziness, weakness, light-headedness and headaches.  Hematological:  Negative for adenopathy. Does not bruise/bleed easily.  Psychiatric/Behavioral: Negative.      Objective:  BP (!) 144/86 (BP Location: Left Arm, Patient Position: Sitting, Cuff Size: Normal) Comment: BP (L) 142/82 (R) 146/88  Pulse 73   Temp 97.6 F (36.4 C) (Oral)   Ht 5\' 10"  (1.778 m)   Wt 187 lb 3.2 oz (84.9 kg)   SpO2 93%   BMI 26.86 kg/m   BP  Readings from Last 3 Encounters:  08/17/23 (!) 144/86  07/08/23 (!) 159/98  06/21/23 134/80    Wt Readings from Last 3 Encounters:  08/17/23 187 lb 3.2 oz (84.9 kg)  06/21/23 187 lb (84.8 kg)  05/02/23 188 lb (85.3 kg)    Physical Exam Vitals reviewed.  HENT:     Mouth/Throat:     Mouth: Mucous membranes are moist.  Eyes:     General: No scleral icterus.    Conjunctiva/sclera: Conjunctivae normal.  Cardiovascular:     Rate and  Rhythm: Normal rate and regular rhythm.     Heart sounds: No murmur heard. Pulmonary:     Effort: Pulmonary effort is normal.     Breath sounds: No stridor. No wheezing, rhonchi or rales.  Abdominal:     General: Abdomen is flat.     Palpations: There is no mass.     Tenderness: There is no abdominal tenderness. There is no guarding.     Hernia: No hernia is present.  Musculoskeletal:        General: Normal range of motion.     Cervical back: Neck supple.     Right lower leg: No edema.     Left lower leg: No edema.  Lymphadenopathy:     Cervical: No cervical adenopathy.  Skin:    General: Skin is warm and dry.     Findings: No rash.  Neurological:     General: No focal deficit present.     Mental Status: He is alert. Mental status is at baseline.  Psychiatric:        Mood and Affect: Mood normal.        Behavior: Behavior normal.     Lab Results  Component Value Date   WBC 6.0 07/08/2023   HGB 15.3 07/08/2023   HCT 44.6 07/08/2023   PLT 308 07/08/2023   GLUCOSE 93 08/17/2023   CHOL 129 08/17/2023   TRIG 90.0 08/17/2023   HDL 42.80 08/17/2023   LDLDIRECT 136.1 09/11/2010   LDLCALC 68 08/17/2023   ALT 24 07/08/2023   AST 22 07/08/2023   NA 141 08/17/2023   K 4.4 08/17/2023   CL 103 08/17/2023   CREATININE 1.13 08/17/2023   BUN 16 08/17/2023   CO2 33 (H) 08/17/2023   TSH 1.46 12/21/2022   PSA 1.03 12/21/2022   HGBA1C 6.0 08/17/2023    No results found.  Assessment & Plan:   Hyperlipidemia with target LDL less than 130 - LDL goal achieved. Doing well on the statin  -     Lipid panel; Future  Primary hypertension- His BP is not a goal. Will start indapamide. -     Basic metabolic panel; Future -     Hemoglobin A1c; Future -     Indapamide; Take 1 tablet (1.25 mg total) by mouth daily.  Dispense: 90 tablet; Refill: 1  Hyperglycemia- He is prediabetic. -     Hemoglobin A1c; Future     Follow-up: Return in about 4 months (around 12/15/2023).  Sanda Linger, MD

## 2023-08-17 NOTE — Patient Instructions (Signed)
Hypertension, Adult High blood pressure (hypertension) is when the force of blood pumping through the arteries is too strong. The arteries are the blood vessels that carry blood from the heart throughout the body. Hypertension forces the heart to work harder to pump blood and may cause arteries to become narrow or stiff. Untreated or uncontrolled hypertension can lead to a heart attack, heart failure, a stroke, kidney disease, and other problems. A blood pressure reading consists of a higher number over a lower number. Ideally, your blood pressure should be below 120/80. The first ("top") number is called the systolic pressure. It is a measure of the pressure in your arteries as your heart beats. The second ("bottom") number is called the diastolic pressure. It is a measure of the pressure in your arteries as the heart relaxes. What are the causes? The exact cause of this condition is not known. There are some conditions that result in high blood pressure. What increases the risk? Certain factors may make you more likely to develop high blood pressure. Some of these risk factors are under your control, including: Smoking. Not getting enough exercise or physical activity. Being overweight. Having too much fat, sugar, calories, or salt (sodium) in your diet. Drinking too much alcohol. Other risk factors include: Having a personal history of heart disease, diabetes, high cholesterol, or kidney disease. Stress. Having a family history of high blood pressure and high cholesterol. Having obstructive sleep apnea. Age. The risk increases with age. What are the signs or symptoms? High blood pressure may not cause symptoms. Very high blood pressure (hypertensive crisis) may cause: Headache. Fast or irregular heartbeats (palpitations). Shortness of breath. Nosebleed. Nausea and vomiting. Vision changes. Severe chest pain, dizziness, and seizures. How is this diagnosed? This condition is diagnosed by  measuring your blood pressure while you are seated, with your arm resting on a flat surface, your legs uncrossed, and your feet flat on the floor. The cuff of the blood pressure monitor will be placed directly against the skin of your upper arm at the level of your heart. Blood pressure should be measured at least twice using the same arm. Certain conditions can cause a difference in blood pressure between your right and left arms. If you have a high blood pressure reading during one visit or you have normal blood pressure with other risk factors, you may be asked to: Return on a different day to have your blood pressure checked again. Monitor your blood pressure at home for 1 week or longer. If you are diagnosed with hypertension, you may have other blood or imaging tests to help your health care provider understand your overall risk for other conditions. How is this treated? This condition is treated by making healthy lifestyle changes, such as eating healthy foods, exercising more, and reducing your alcohol intake. You may be referred for counseling on a healthy diet and physical activity. Your health care provider may prescribe medicine if lifestyle changes are not enough to get your blood pressure under control and if: Your systolic blood pressure is above 130. Your diastolic blood pressure is above 80. Your personal target blood pressure may vary depending on your medical conditions, your age, and other factors. Follow these instructions at home: Eating and drinking  Eat a diet that is high in fiber and potassium, and low in sodium, added sugar, and fat. An example of this eating plan is called the DASH diet. DASH stands for Dietary Approaches to Stop Hypertension. To eat this way: Eat   plenty of fresh fruits and vegetables. Try to fill one half of your plate at each meal with fruits and vegetables. Eat whole grains, such as whole-wheat pasta, brown rice, or whole-grain bread. Fill about one  fourth of your plate with whole grains. Eat or drink low-fat dairy products, such as skim milk or low-fat yogurt. Avoid fatty cuts of meat, processed or cured meats, and poultry with skin. Fill about one fourth of your plate with lean proteins, such as fish, chicken without skin, beans, eggs, or tofu. Avoid pre-made and processed foods. These tend to be higher in sodium, added sugar, and fat. Reduce your daily sodium intake. Many people with hypertension should eat less than 1,500 mg of sodium a day. Do not drink alcohol if: Your health care provider tells you not to drink. You are pregnant, may be pregnant, or are planning to become pregnant. If you drink alcohol: Limit how much you have to: 0-1 drink a day for women. 0-2 drinks a day for men. Know how much alcohol is in your drink. In the [place] one drink equals one 12 oz bottle of beer (355 mL), one 5 oz glass of wine (148 mL), or one 1 oz glass of hard liquor (44 mL). Lifestyle  Work with your health care provider to maintain a healthy body weight or to lose weight. Ask what an ideal weight is for you. Get at least 30 minutes of exercise that causes your heart to beat faster (aerobic exercise) most days of the week. Activities may include walking, swimming, or biking. Include exercise to strengthen your muscles (resistance exercise), such as Pilates or lifting weights, as part of your weekly exercise routine. Try to do these types of exercises for 30 minutes at least 3 days a week. Do not use any products that contain nicotine or tobacco. These products include cigarettes, chewing tobacco, and vaping devices, such as e-cigarettes. If you need help quitting, ask your health care provider. Monitor your blood pressure at home as told by your health care provider. Keep all follow-up visits. This is important. Medicines Take over-the-counter and prescription medicines only as told by your health care provider. Follow directions carefully. Blood  pressure medicines must be taken as prescribed. Do not skip doses of blood pressure medicine. Doing this puts you at risk for problems and can make the medicine less effective. Ask your health care provider about side effects or reactions to medicines that you should watch for. Contact a health care provider if you: Think you are having a reaction to a medicine you are taking. Have headaches that keep coming back (recurring). Feel dizzy. Have swelling in your ankles. Have trouble with your vision. Get help right away if you: Develop a severe headache or confusion. Have unusual weakness or numbness. Feel faint. Have severe pain in your chest or abdomen. Vomit repeatedly. Have trouble breathing. These symptoms may be an emergency. Get help right away. Call 911. Do not wait to see if the symptoms will go away. Do not drive yourself to the hospital. Summary Hypertension is when the force of blood pumping through your arteries is too strong. If this condition is not controlled, it may put you at risk for serious complications. Your personal target blood pressure may vary depending on your medical conditions, your age, and other factors. For most people, a normal blood pressure is less than 120/80. Hypertension is treated with lifestyle changes, medicines, or a combination of both. Lifestyle changes include losing weight, eating a healthy,   low-sodium diet, exercising more, and limiting alcohol. This information is not intended to replace advice given to you by your health care provider. Make sure you discuss any questions you have with your health care provider. Document Revised: 07/21/2021 Document Reviewed: 07/21/2021 Elsevier Patient Education  2024 [employer]  

## 2023-08-17 NOTE — Progress Notes (Signed)
Subjective:   Patient ID: Cory Joyce, male   DOB: 67 y.o.   MRN: [id number]   HPI Patient presents stating the ingrown toenail is healing very well but he is concerned about some fungal element to his toenails and after having had culture done of this and dry skin   ROS      Objective:  Physical Exam  Neurovascular status intact well-healed surgical site right big toe with slight discoloration third and fourth nailbed right and dryness of skin plantarly heel region bilateral     Assessment:  Mycotic nail infection lesion formation     Plan:  Discussed all conditions were going to start him on topical antifungal may consider a pulse Lamisil but he already has done oral antifungal therapy and will start on cream to try to help with dry skin.  Reappoint to recheck as needed

## 2023-08-19 ENCOUNTER — Encounter: Payer: Self-pay | Admitting: Internal Medicine

## 2023-08-23 ENCOUNTER — Other Ambulatory Visit: Payer: Self-pay | Admitting: Internal Medicine

## 2023-10-20 ENCOUNTER — Other Ambulatory Visit: Payer: Self-pay | Admitting: Internal Medicine

## 2023-10-20 DIAGNOSIS — E785 Hyperlipidemia, unspecified: Secondary | ICD-10-CM

## 2023-12-22 ENCOUNTER — Ambulatory Visit (INDEPENDENT_AMBULATORY_CARE_PROVIDER_SITE_OTHER): Payer: BC Managed Care – PPO | Admitting: Internal Medicine

## 2023-12-22 ENCOUNTER — Encounter: Payer: Self-pay | Admitting: Internal Medicine

## 2023-12-22 VITALS — BP 132/78 | HR 81 | Temp 97.6°F | Resp 16 | Ht 70.0 in | Wt 189.2 lb

## 2023-12-22 DIAGNOSIS — R7303 Prediabetes: Secondary | ICD-10-CM | POA: Diagnosis not present

## 2023-12-22 DIAGNOSIS — R0609 Other forms of dyspnea: Secondary | ICD-10-CM

## 2023-12-22 DIAGNOSIS — Z0000 Encounter for general adult medical examination without abnormal findings: Secondary | ICD-10-CM

## 2023-12-22 DIAGNOSIS — N400 Enlarged prostate without lower urinary tract symptoms: Secondary | ICD-10-CM | POA: Diagnosis not present

## 2023-12-22 DIAGNOSIS — I10 Essential (primary) hypertension: Secondary | ICD-10-CM

## 2023-12-22 DIAGNOSIS — Z0001 Encounter for general adult medical examination with abnormal findings: Secondary | ICD-10-CM | POA: Insufficient documentation

## 2023-12-22 DIAGNOSIS — E785 Hyperlipidemia, unspecified: Secondary | ICD-10-CM | POA: Diagnosis not present

## 2023-12-22 NOTE — Patient Instructions (Signed)
 Health Maintenance, Male  Adopting a healthy lifestyle and getting preventive care are important in promoting health and wellness. Ask your health care provider about:  The right schedule for you to have regular tests and exams.  Things you can do on your own to prevent diseases and keep yourself healthy.  What should I know about diet, weight, and exercise?  Eat a healthy diet    Eat a diet that includes plenty of vegetables, fruits, low-fat dairy products, and lean protein.  Do not eat a lot of foods that are high in solid fats, added sugars, or sodium.  Maintain a healthy weight  Body mass index (BMI) is a measurement that can be used to identify possible weight problems. It estimates body fat based on height and weight. Your health care provider can help determine your BMI and help you achieve or maintain a healthy weight.  Get regular exercise  Get regular exercise. This is one of the most important things you can do for your health. Most adults should:  Exercise for at least 150 minutes each week. The exercise should increase your heart rate and make you sweat (moderate-intensity exercise).  Do strengthening exercises at least twice a week. This is in addition to the moderate-intensity exercise.  Spend less time sitting. Even light physical activity can be beneficial.  Watch cholesterol and blood lipids  Have your blood tested for lipids and cholesterol at 67 years of age, then have this test every 5 years.  You may need to have your cholesterol levels checked more often if:  Your lipid or cholesterol levels are high.  You are older than 67 years of age.  You are at high risk for heart disease.  What should I know about cancer screening?  Many types of cancers can be detected early and may often be prevented. Depending on your health history and family history, you may need to have cancer screening at various ages. This may include screening for:  Colorectal cancer.  Prostate cancer.  Skin cancer.  Lung  cancer.  What should I know about heart disease, diabetes, and high blood pressure?  Blood pressure and heart disease  High blood pressure causes heart disease and increases the risk of stroke. This is more likely to develop in people who have high blood pressure readings or are overweight.  Talk with your health care provider about your target blood pressure readings.  Have your blood pressure checked:  Every 3-5 years if you are 9-95 years of age.  Every year if you are 85 years old or older.  If you are between the ages of 29 and 29 and are a current or former smoker, ask your health care provider if you should have a one-time screening for abdominal aortic aneurysm (AAA).  Diabetes  Have regular diabetes screenings. This checks your fasting blood sugar level. Have the screening done:  Once every three years after age 23 if you are at a normal weight and have a low risk for diabetes.  More often and at a younger age if you are overweight or have a high risk for diabetes.  What should I know about preventing infection?  Hepatitis B  If you have a higher risk for hepatitis B, you should be screened for this virus. Talk with your health care provider to find out if you are at risk for hepatitis B infection.  Hepatitis C  Blood testing is recommended for:  Everyone born from [birth year] through 1964.  Anyone  with known risk factors for hepatitis C.  Sexually transmitted infections (STIs)  You should be screened each year for STIs, including gonorrhea and chlamydia, if:  You are sexually active and are younger than 67 years of age.  You are older than 67 years of age and your health care provider tells you that you are at risk for this type of infection.  Your sexual activity has changed since you were last screened, and you are at increased risk for chlamydia or gonorrhea. Ask your health care provider if you are at risk.  Ask your health care provider about whether you are at high risk for HIV. Your health care provider  may recommend a prescription medicine to help prevent HIV infection. If you choose to take medicine to prevent HIV, you should first get tested for HIV. You should then be tested every 3 months for as long as you are taking the medicine.  Follow these instructions at home:  Alcohol use  Do not drink alcohol if your health care provider tells you not to drink.  If you drink alcohol:  Limit how much you have to 0-2 drinks a day.  Know how much alcohol is in your drink. In the [place] one drink equals one 12 oz bottle of beer (355 mL), one 5 oz glass of wine (148 mL), or one 1 oz glass of hard liquor (44 mL).  Lifestyle  Do not use any products that contain nicotine or tobacco. These products include cigarettes, chewing tobacco, and vaping devices, such as e-cigarettes. If you need help quitting, ask your health care provider.  Do not use street drugs.  Do not share needles.  Ask your health care provider for help if you need support or information about quitting drugs.  General instructions  Schedule regular health, dental, and eye exams.  Stay current with your vaccines.  Tell your health care provider if:  You often feel depressed.  You have ever been abused or do not feel safe at home.  Summary  Adopting a healthy lifestyle and getting preventive care are important in promoting health and wellness.  Follow your health care provider's instructions about healthy diet, exercising, and getting tested or screened for diseases.  Follow your health care provider's instructions on monitoring your cholesterol and blood pressure.  This information is not intended to replace advice given to you by your health care provider. Make sure you discuss any questions you have with your health care provider.  Document Revised: 02/02/2021 Document Reviewed: 02/02/2021  Elsevier Patient Education  2024 [employer].

## 2023-12-22 NOTE — Progress Notes (Signed)
 Subjective:  Patient ID: Cory Joyce, male    DOB: [birth year]  Age: 68 y.o. MRN: [id number]  CC: Annual Exam, Hypertension, and Hyperlipidemia   HPI Cory Joyce presents for a CPX and f/up ----  Discussed the use of AI scribe software for clinical note transcription with the patient, who gave verbal consent to proceed.  History of Present Illness   [name] See "Cory Joyce" is a 68 year old male who presents with shortness of breath on exertion.  He has experienced shortness of breath on exertion for the past couple of years. When attempting to jog, he can only manage about thirty seconds before needing to slow down to a walk. Despite this, he is able to perform activities such as mowing the lawn, raking leaves, and chopping wood without significant issues. No new or worsening symptoms related to this shortness of breath.  He recalls an episode of syncope last fall but reports no recurrence of such episodes in the past 180 days. No dizziness or feeling of hypotension during this period.  He mentions having unusually cold hands and feet, which was noted during a visit to a chiropractor. He did not pursue the suggested nerve stimulation treatment due to cost.  He denies experiencing muscle or joint aches from his statin medication, aside from pre-existing lower back issues. He also denies any swelling in his legs or feet, and he has not noticed any tightness in his shoes. He mentions that his wife is on a salt-restricted diet, which has led him to reduce his salt intake.  He denies any symptoms of an enlarged prostate gland, such as pain or swelling, although he notes that it sometimes takes longer to void completely. He also denies any blood in his stool.  He spends much of his day sitting for work and acknowledges a lack of regular exercise.       Outpatient Medications Prior to Visit  Medication Sig Dispense Refill   aspirin 81 MG tablet Take 81 mg by mouth  daily.     AZSTARYS 52.3-10.4 MG CAPS Take 1 capsule by mouth daily.     celecoxib (CELEBREX) 200 MG capsule TAKE 1 CAPSULE (200 MG TOTAL) BY MOUTH DAILY. TAKE WITH FOOD 90 capsule 1   fexofenadine (ALLEGRA) 180 MG tablet Take 180 mg by mouth as needed for allergies.     methylphenidate (RITALIN) 10 MG tablet Take 10 mg by mouth every evening.     Multiple Vitamin (MULTIVITAMIN WITH MINERALS) TABS tablet Take 1 tablet by mouth daily.     atorvastatin (LIPITOR) 40 MG tablet TAKE 1 TABLET BY MOUTH EVERY DAY 90 tablet 0   indapamide (LOZOL) 1.25 MG tablet Take 1 tablet (1.25 mg total) by mouth daily. 90 tablet 1   Methylphenidate 25.9 MG TBED Take 1 tablet by mouth daily with breakfast.     UNABLE TO FIND Take 2 capsules by mouth in the morning and at bedtime. Med Name: ArteClear Blood Pressure     No facility-administered medications prior to visit.    ROS Review of Systems  Constitutional: Negative.  Negative for appetite change, diaphoresis, fatigue and unexpected weight change.  HENT: Negative.    Eyes: Negative.   Respiratory:  Positive for shortness of breath. Negative for choking, chest tightness and wheezing.   Cardiovascular:  Negative for chest pain, palpitations and leg swelling.  Gastrointestinal: Negative.  Negative for abdominal pain, constipation, diarrhea, nausea and vomiting.  Endocrine: Negative.   Genitourinary: Negative.  Negative  for difficulty urinating.  Musculoskeletal:  Negative for arthralgias and myalgias.  Skin: Negative.   Neurological: Negative.  Negative for dizziness, weakness and headaches.  Hematological:  Negative for adenopathy. Does not bruise/bleed easily.  Psychiatric/Behavioral: Negative.      Objective:  BP 132/78 (BP Location: Left Arm, Patient Position: Sitting, Cuff Size: Normal)   Pulse 81   Temp 97.6 F (36.4 C) (Oral)   Resp 16   Ht 5\' 10"  (1.778 m)   Wt 189 lb 3.2 oz (85.8 kg)   SpO2 98%   BMI 27.15 kg/m   BP Readings from Last 3  Encounters:  12/22/23 132/78  08/17/23 (!) 144/86  07/08/23 (!) 159/98    Wt Readings from Last 3 Encounters:  12/22/23 189 lb 3.2 oz (85.8 kg)  08/17/23 187 lb 3.2 oz (84.9 kg)  06/21/23 187 lb (84.8 kg)    Physical Exam Vitals reviewed.  Constitutional:      Appearance: Normal appearance.  HENT:     Nose: Nose normal.     Mouth/Throat:     Mouth: Mucous membranes are moist.  Eyes:     General: No scleral icterus.    Conjunctiva/sclera: Conjunctivae normal.  Cardiovascular:     Rate and Rhythm: Normal rate and regular rhythm.     Heart sounds: No murmur heard.    No friction rub. No gallop.     Comments: EKG-- NSR, 72 bpm No LVH, Q waves, or ST/T wave changes  Pulmonary:     Effort: Pulmonary effort is normal.     Breath sounds: No stridor. No wheezing, rhonchi or rales.  Abdominal:     General: Abdomen is flat.     Palpations: There is no mass.     Tenderness: There is no abdominal tenderness. There is no guarding.     Hernia: No hernia is present. There is no hernia in the left inguinal area or right inguinal area.  Genitourinary:    Pubic Area: No rash.      Penis: Normal and circumcised.      Testes: Normal.     Epididymis:     Right: Normal.     Left: Normal.     Prostate: Normal. Not enlarged, not tender and no nodules present.     Rectum: Normal. Guaiac result negative. No mass, tenderness, anal fissure, external hemorrhoid or internal hemorrhoid. Normal anal tone.  Musculoskeletal:        General: Normal range of motion.     Cervical back: Neck supple.     Right lower leg: No edema.     Left lower leg: No edema.  Lymphadenopathy:     Cervical: No cervical adenopathy.     Lower Body: No right inguinal adenopathy. No left inguinal adenopathy.  Skin:    General: Skin is warm and dry.  Neurological:     General: No focal deficit present.     Mental Status: He is alert. Mental status is at baseline.  Psychiatric:        Mood and Affect: Mood normal.         Behavior: Behavior normal.     Lab Results  Component Value Date   WBC 6.2 12/23/2023   HGB 15.7 12/23/2023   HCT 46.0 12/23/2023   PLT 271.0 12/23/2023   GLUCOSE 116 (H) 12/23/2023   CHOL 124 12/23/2023   TRIG 69.0 12/23/2023   HDL 44.70 12/23/2023   LDLDIRECT 136.1 09/11/2010   LDLCALC 66 12/23/2023   ALT 24  07/08/2023   AST 22 07/08/2023   NA 138 12/23/2023   K 4.2 12/23/2023   CL 98 12/23/2023   CREATININE 1.09 12/23/2023   BUN 18 12/23/2023   CO2 32 12/23/2023   TSH 1.09 12/23/2023   PSA 1.27 12/23/2023   HGBA1C 6.0 12/23/2023    No results found.  Assessment & Plan:  Hyperlipidemia with target LDL less than 130- LDL goal achieved. Doing well on the statin  -     Lipid panel; Future -     TSH; Future -     Atorvastatin Calcium; Take 1 tablet (40 mg total) by mouth daily.  Dispense: 90 tablet; Refill: 1  Encounter for general adult medical examination with abnormal findings- Exam completed, labs reviewed, vaccines reviewed, cancer screenings addressed, pt ed material was given.   Prediabetes -     Hemoglobin A1c; Future -     Basic metabolic panel with GFR; Future  Primary hypertension- BP is well controlled. -     TSH; Future -     Urinalysis, Routine w reflex microscopic; Future -     CBC with Differential/Platelet; Future -     Basic metabolic panel with GFR; Future -     EKG 12-Lead -     Indapamide; Take 1 tablet (1.25 mg total) by mouth daily.  Dispense: 90 tablet; Refill: 1  Benign prostatic hyperplasia without lower urinary tract symptoms -     Urinalysis, Routine w reflex microscopic; Future -     PSA; Future  DOE (dyspnea on exertion)- The EKG is reassuring. -     EKG 12-Lead     Follow-up: Return in about 6 months (around 06/23/2024).  Sanda Linger, MD

## 2023-12-23 ENCOUNTER — Other Ambulatory Visit (INDEPENDENT_AMBULATORY_CARE_PROVIDER_SITE_OTHER)

## 2023-12-23 DIAGNOSIS — R7303 Prediabetes: Secondary | ICD-10-CM

## 2023-12-23 DIAGNOSIS — I10 Essential (primary) hypertension: Secondary | ICD-10-CM

## 2023-12-23 DIAGNOSIS — N400 Enlarged prostate without lower urinary tract symptoms: Secondary | ICD-10-CM | POA: Diagnosis not present

## 2023-12-23 DIAGNOSIS — E785 Hyperlipidemia, unspecified: Secondary | ICD-10-CM | POA: Diagnosis not present

## 2023-12-23 LAB — URINALYSIS, ROUTINE W REFLEX MICROSCOPIC
Bilirubin Urine: NEGATIVE
Hgb urine dipstick: NEGATIVE
Ketones, ur: NEGATIVE
Leukocytes,Ua: NEGATIVE
Nitrite: NEGATIVE
Specific Gravity, Urine: 1.01 (ref 1.000–1.030)
Total Protein, Urine: NEGATIVE
Urine Glucose: NEGATIVE
Urobilinogen, UA: 0.2 (ref 0.0–1.0)
pH: 7 (ref 5.0–8.0)

## 2023-12-23 LAB — CBC WITH DIFFERENTIAL/PLATELET
Basophils Absolute: 0 10*3/uL (ref 0.0–0.1)
Basophils Relative: 0.6 % (ref 0.0–3.0)
Eosinophils Absolute: 0.3 10*3/uL (ref 0.0–0.7)
Eosinophils Relative: 5.1 % — ABNORMAL HIGH (ref 0.0–5.0)
HCT: 46 % (ref 39.0–52.0)
Hemoglobin: 15.7 g/dL (ref 13.0–17.0)
Lymphocytes Relative: 46.8 % — ABNORMAL HIGH (ref 12.0–46.0)
Lymphs Abs: 2.9 10*3/uL (ref 0.7–4.0)
MCHC: 34.1 g/dL (ref 30.0–36.0)
MCV: 85.1 fl (ref 78.0–100.0)
Monocytes Absolute: 0.7 10*3/uL (ref 0.1–1.0)
Monocytes Relative: 10.6 % (ref 3.0–12.0)
Neutro Abs: 2.3 10*3/uL (ref 1.4–7.7)
Neutrophils Relative %: 36.9 % — ABNORMAL LOW (ref 43.0–77.0)
Platelets: 271 10*3/uL (ref 150.0–400.0)
RBC: 5.41 Mil/uL (ref 4.22–5.81)
RDW: 15.1 % (ref 11.5–15.5)
WBC: 6.2 10*3/uL (ref 4.0–10.5)

## 2023-12-23 LAB — BASIC METABOLIC PANEL WITH GFR
BUN: 18 mg/dL (ref 6–23)
CO2: 32 meq/L (ref 19–32)
Calcium: 9.8 mg/dL (ref 8.4–10.5)
Chloride: 98 meq/L (ref 96–112)
Creatinine, Ser: 1.09 mg/dL (ref 0.40–1.50)
GFR: 70.85 mL/min (ref >60.00)
Glucose, Bld: 116 mg/dL — ABNORMAL HIGH (ref 70–99)
Potassium: 4.2 meq/L (ref 3.5–5.1)
Sodium: 138 meq/L (ref 135–145)

## 2023-12-23 LAB — TSH: TSH: 1.09 u[IU]/mL (ref 0.35–5.50)

## 2023-12-23 LAB — LIPID PANEL
Cholesterol: 124 mg/dL (ref 0–200)
HDL: 44.7 mg/dL (ref >39.00)
LDL Cholesterol: 66 mg/dL (ref 0–99)
NonHDL: 79.63
Total CHOL/HDL Ratio: 3
Triglycerides: 69 mg/dL (ref 0.0–149.0)
VLDL: 13.8 mg/dL (ref 0.0–40.0)

## 2023-12-23 LAB — PSA: PSA: 1.27 ng/mL (ref 0.10–4.00)

## 2023-12-24 ENCOUNTER — Encounter: Payer: Self-pay | Admitting: Internal Medicine

## 2023-12-24 LAB — HEMOGLOBIN A1C: Hgb A1c MFr Bld: 6 % (ref 4.6–6.5)

## 2023-12-26 MED ORDER — INDAPAMIDE 1.25 MG PO TABS: 1.2500 mg | ORAL_TABLET | Freq: Every day | ORAL | 1 refills | Status: DC

## 2023-12-26 MED ORDER — ATORVASTATIN CALCIUM 40 MG PO TABS: 40.0000 mg | ORAL_TABLET | Freq: Every day | ORAL | 1 refills | Status: DC

## 2024-02-23 ENCOUNTER — Other Ambulatory Visit: Payer: Self-pay | Admitting: Internal Medicine

## 2024-04-30 NOTE — Progress Notes (Unsigned)
 HPI M never smoker followed for OSA, complicated by Hearing loss, ADHD, HTN,  HST 19/29/17- AHI 15/ hr, desaturation to 77%, body weight 188 lb =================================================   05/02/23- 66 yoM never smoker followed for OSA, complicated by Hearing loss, ADHD, HTN,   -Ritalin 10 mg 1 each evening, methylphenidate 25.9 mg 2 daily CPAP 9/ [facility]   order replacement 04/24/22 Referral to Dr Micky to consider OAP 04/24/21 with change to auto 5-15 Not done Body weight today-188 lbs Download compliance 93%, AHI 2.6/ hr Download reviewed. Using oral appliance from Dr Micky as well as his old CPAP machine still set on 9. He will check with his insurance and also with D[facility] about replacing old CPAP machine, since insurance recently paid for his OAP.  05/01/24- 67 yoM never smoker followed for OSA, complicated by Hearing loss, ADHD, HTN,   -Ritalin 10 mg 1 each evening, methylphenidate 25.9 mg 2 daily CPAP 5-15/ [facility]   order replacement 05/02/23 Referral to Dr Micky to consider OAP 04/24/21  . Was Using oral appliance from Dr Micky as well as his CPAP. Body weight today-190 lbs Download compliance 100%, AHI 2.4/hr      ROS- + = positive Constitutional:    weight loss, night sweats, fevers, chills, fatigue, lassitude. HEENT:    headaches, difficulty swallowing, tooth/dental problems, sore throat,       sneezing, itching, ear ache, nasal congestion, post nasal drip, snoring, + hearing loss, CV:    chest pain, orthopnea, PND, swelling in lower extremities, anasarca,                                   dizziness, palpitations Resp:   shortness of breath with exertion or at rest.                productive cough,   non-productive cough, coughing up of blood.              change in color of mucus.  wheezing.   Skin:    rash or lesions. GI:  No-   heartburn, indigestion, abdominal pain, nausea, vomiting, diarrhea,                 change in bowel habits, loss of appetite GU: dysuria, change  in color of urine, no urgency or frequency.   flank pain. MS:   joint pain, stiffness, decreased range of motion, back pain. Neuro-     +ADHD- treated Psych:  change in mood or affect.  depression or anxiety.   memory loss.  OBJ- Physical Exam General- Alert, Oriented, Affect-appropriate, Distress- none acute, not obese Skin- rash-none, lesions- none, excoriation- none Lymphadenopathy- none Head- atraumatic            Eyes- Gross vision intact, PERRLA, conjunctivae and secretions clear            Ears- Hearing, canals-normal            Nose- Clear, no-Septal dev, mucus, polyps, erosion, perforation             Throat- Mallampati II , mucosa clear , drainage- none, tonsils- atrophic, + teeth Neck- flexible , trachea midline, no stridor , thyroid  nl, carotid no bruit Chest - symmetrical excursion , unlabored           Heart/CV- RRR , no murmur , no gallop  , no rub, nl s1 s2                           -  JVD- none , edema- none, stasis changes- none, varices- none           Lung- clear to P&A, wheeze- none, cough- none , dullness-none, rub- none           Chest wall-  Abd-  Br/ Gen/ Rectal- Not done, not indicated Extrem- cyanosis- none, clubbing, none, atrophy- none, strength- nl Neuro- grossly intact to observation

## 2024-05-01 ENCOUNTER — Encounter: Payer: Self-pay | Admitting: Internal Medicine

## 2024-05-01 ENCOUNTER — Ambulatory Visit (INDEPENDENT_AMBULATORY_CARE_PROVIDER_SITE_OTHER): Payer: BC Managed Care – PPO | Admitting: Internal Medicine

## 2024-05-01 VITALS — BP 138/81 | HR 52 | Temp 97.8°F | Ht 70.0 in | Wt 190.4 lb

## 2024-05-01 DIAGNOSIS — G4733 Obstructive sleep apnea (adult) (pediatric): Secondary | ICD-10-CM

## 2024-05-01 NOTE — Patient Instructions (Signed)
 Glad you are doing  well  We can continue auto 5-15

## 2024-06-08 ENCOUNTER — Other Ambulatory Visit: Payer: Self-pay | Admitting: Internal Medicine

## 2024-06-08 DIAGNOSIS — E785 Hyperlipidemia, unspecified: Secondary | ICD-10-CM

## 2024-07-07 ENCOUNTER — Other Ambulatory Visit: Payer: Self-pay | Admitting: Internal Medicine

## 2024-07-07 DIAGNOSIS — E785 Hyperlipidemia, unspecified: Secondary | ICD-10-CM

## 2024-07-11 ENCOUNTER — Other Ambulatory Visit: Payer: Self-pay | Admitting: Internal Medicine

## 2024-07-11 DIAGNOSIS — E785 Hyperlipidemia, unspecified: Secondary | ICD-10-CM

## 2024-07-22 ENCOUNTER — Other Ambulatory Visit: Payer: Self-pay | Admitting: Internal Medicine

## 2024-07-22 DIAGNOSIS — I10 Essential (primary) hypertension: Secondary | ICD-10-CM

## 2024-08-05 ENCOUNTER — Other Ambulatory Visit: Payer: Self-pay | Admitting: Internal Medicine

## 2024-08-05 DIAGNOSIS — E785 Hyperlipidemia, unspecified: Secondary | ICD-10-CM

## 2024-08-25 ENCOUNTER — Other Ambulatory Visit: Payer: Self-pay | Admitting: Internal Medicine

## 2024-09-30 ENCOUNTER — Other Ambulatory Visit: Payer: Self-pay | Admitting: Internal Medicine

## 2024-12-24 ENCOUNTER — Encounter: Admitting: Internal Medicine
